# Patient Record
Sex: Female | Born: 1980 | Hispanic: No | Marital: Married | Smoking: Never smoker
Health system: Southern US, Community
[De-identification: ages and names within clinical notes are randomized; demographics above are authoritative.]

---

## 2008-05-10 ENCOUNTER — Encounter: Payer: Self-pay | Admitting: Family

## 2008-05-10 ENCOUNTER — Ambulatory Visit: Payer: Self-pay | Admitting: Obstetrics & Gynecology

## 2008-05-10 LAB — CONVERTED CEMR LAB
Antibody Screen: NEGATIVE
Basophils Relative: 0 % (ref 0–1)
Eosinophils Absolute: 0 10*3/uL (ref 0.0–0.7)
Eosinophils Relative: 0 % (ref 0–5)
HCT: 38.9 % (ref 36.0–46.0)
Hepatitis B Surface Ag: NEGATIVE
MCHC: 33.4 g/dL (ref 30.0–36.0)
MCV: 97.7 fL (ref 78.0–100.0)
Neutrophils Relative %: 65 % (ref 43–77)
Platelets: 272 10*3/uL (ref 150–400)

## 2008-05-11 ENCOUNTER — Ambulatory Visit (HOSPITAL_COMMUNITY): Admission: RE | Admit: 2008-05-11 | Discharge: 2008-05-11 | Payer: Self-pay | Admitting: Family Medicine

## 2008-05-22 ENCOUNTER — Encounter: Payer: Self-pay | Admitting: Obstetrics & Gynecology

## 2008-05-22 ENCOUNTER — Ambulatory Visit: Payer: Self-pay | Admitting: Family Medicine

## 2008-05-22 LAB — CONVERTED CEMR LAB
AST: 19 units/L (ref 0–37)
Albumin: 3.7 g/dL (ref 3.5–5.2)
Alkaline Phosphatase: 95 units/L (ref 39–117)
BUN: 8 mg/dL (ref 6–23)
Calcium: 8.8 mg/dL (ref 8.4–10.5)
Chloride: 105 meq/L (ref 96–112)
Glucose, Bld: 70 mg/dL (ref 70–99)
Potassium: 4.4 meq/L (ref 3.5–5.3)
Sodium: 139 meq/L (ref 135–145)
Total Protein: 6.9 g/dL (ref 6.0–8.3)

## 2008-05-24 ENCOUNTER — Ambulatory Visit (HOSPITAL_COMMUNITY): Admission: RE | Admit: 2008-05-24 | Discharge: 2008-05-24 | Payer: Self-pay | Admitting: Obstetrics & Gynecology

## 2008-06-01 ENCOUNTER — Ambulatory Visit (HOSPITAL_COMMUNITY): Admission: RE | Admit: 2008-06-01 | Discharge: 2008-06-01 | Payer: Self-pay | Admitting: Family Medicine

## 2008-06-07 ENCOUNTER — Ambulatory Visit: Payer: Self-pay | Admitting: Obstetrics & Gynecology

## 2008-06-21 ENCOUNTER — Ambulatory Visit: Payer: Self-pay | Admitting: Obstetrics & Gynecology

## 2008-06-28 ENCOUNTER — Ambulatory Visit: Payer: Self-pay | Admitting: Obstetrics & Gynecology

## 2008-06-28 ENCOUNTER — Encounter: Payer: Self-pay | Admitting: Family

## 2008-06-29 ENCOUNTER — Encounter: Payer: Self-pay | Admitting: Family

## 2008-07-05 ENCOUNTER — Ambulatory Visit: Payer: Self-pay | Admitting: Obstetrics & Gynecology

## 2008-07-12 ENCOUNTER — Ambulatory Visit: Payer: Self-pay | Admitting: Obstetrics & Gynecology

## 2008-07-19 ENCOUNTER — Ambulatory Visit: Payer: Self-pay | Admitting: Obstetrics & Gynecology

## 2008-07-24 ENCOUNTER — Ambulatory Visit: Payer: Self-pay | Admitting: Obstetrics & Gynecology

## 2008-07-27 ENCOUNTER — Ambulatory Visit: Payer: Self-pay | Admitting: Advanced Practice Midwife

## 2008-07-27 ENCOUNTER — Inpatient Hospital Stay (HOSPITAL_COMMUNITY): Admission: AD | Admit: 2008-07-27 | Discharge: 2008-07-30 | Payer: Self-pay | Admitting: Obstetrics & Gynecology

## 2009-03-26 ENCOUNTER — Ambulatory Visit: Payer: Self-pay | Admitting: Family Medicine

## 2009-03-26 DIAGNOSIS — N644 Mastodynia: Secondary | ICD-10-CM

## 2010-03-29 ENCOUNTER — Encounter: Payer: Self-pay | Admitting: Family Medicine

## 2010-03-29 ENCOUNTER — Ambulatory Visit: Admission: RE | Admit: 2010-03-29 | Discharge: 2010-03-29 | Payer: Self-pay | Source: Home / Self Care

## 2010-03-29 DIAGNOSIS — R630 Anorexia: Secondary | ICD-10-CM | POA: Insufficient documentation

## 2010-03-29 DIAGNOSIS — F329 Major depressive disorder, single episode, unspecified: Secondary | ICD-10-CM | POA: Insufficient documentation

## 2010-03-29 LAB — CONVERTED CEMR LAB
ALT: 16 units/L (ref 0–35)
AST: 22 units/L (ref 0–37)
Alkaline Phosphatase: 56 units/L (ref 39–117)
CO2: 25 meq/L (ref 19–32)
Creatinine, Ser: 0.56 mg/dL (ref 0.40–1.20)
HCT: 43.7 % (ref 36.0–46.0)
MCHC: 34.1 g/dL (ref 30.0–36.0)
MCV: 90.9 fL (ref 78.0–100.0)
Platelets: 244 10*3/uL (ref 150–400)
RDW: 12.9 % (ref 11.5–15.5)
Sodium: 138 meq/L (ref 135–145)
Total Bilirubin: 1 mg/dL (ref 0.3–1.2)
Total Protein: 8 g/dL (ref 6.0–8.3)
WBC: 8.9 10*3/uL (ref 4.0–10.5)

## 2010-04-10 IMAGING — US US OB DETAIL+14 WK
1 series · 14 of 28 positions shown · non-contrast
Comparison: none

OBSTETRICAL ULTRASOUND:
 This ultrasound exam was performed in the [HOSPITAL] Ultrasound Department.  The OB US report was generated in the AS system, and faxed to the ordering physician.  This report is also available in [REDACTED] PACS.

[Series 1: us ob detail +14 wk · 14 of 80 slices shown]
[im 3/80]
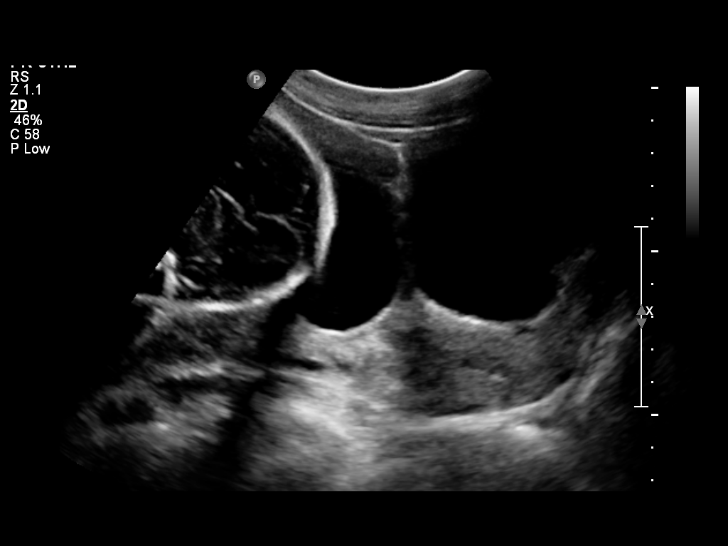
[im 9/80]
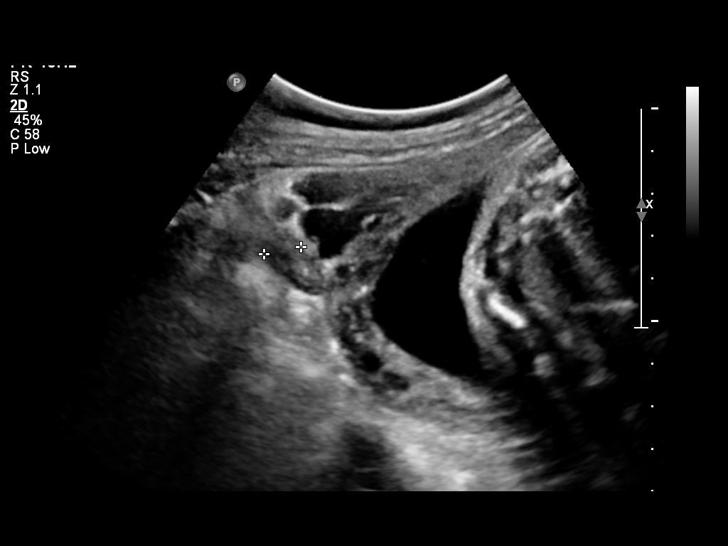
[im 15/80]
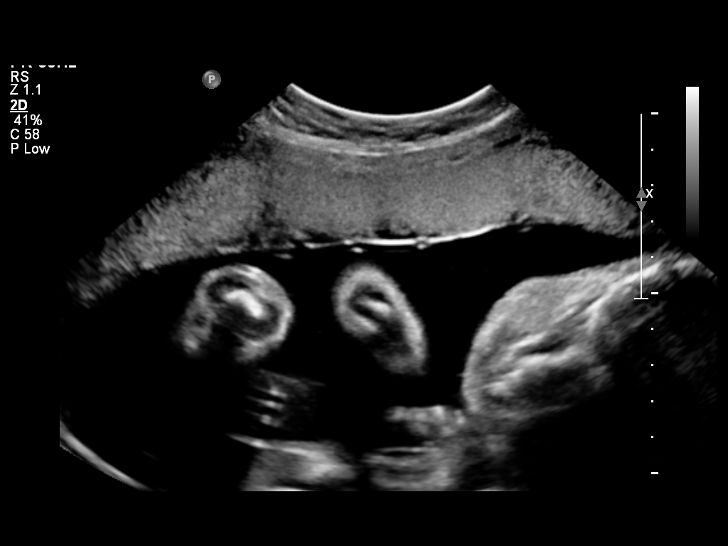
[im 21/80]
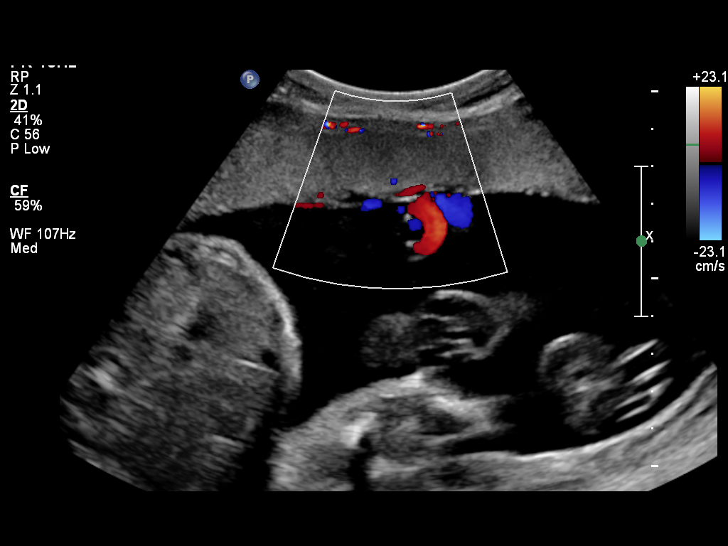
[im 27/80]
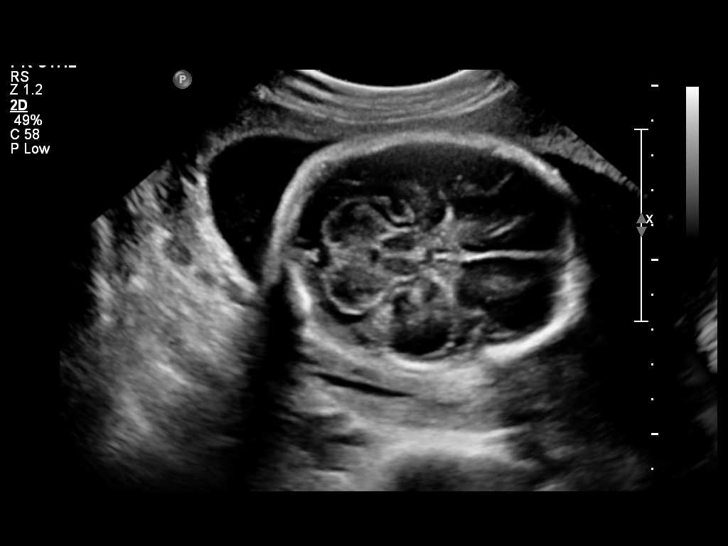
[im 33/80]
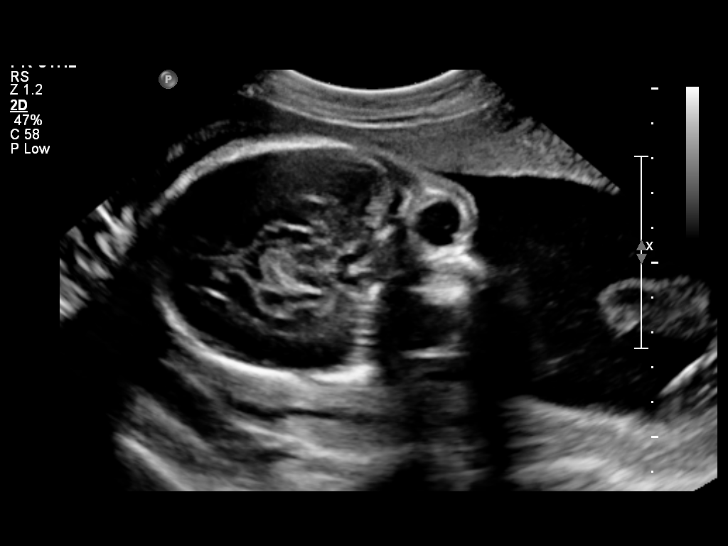
[im 39/80]
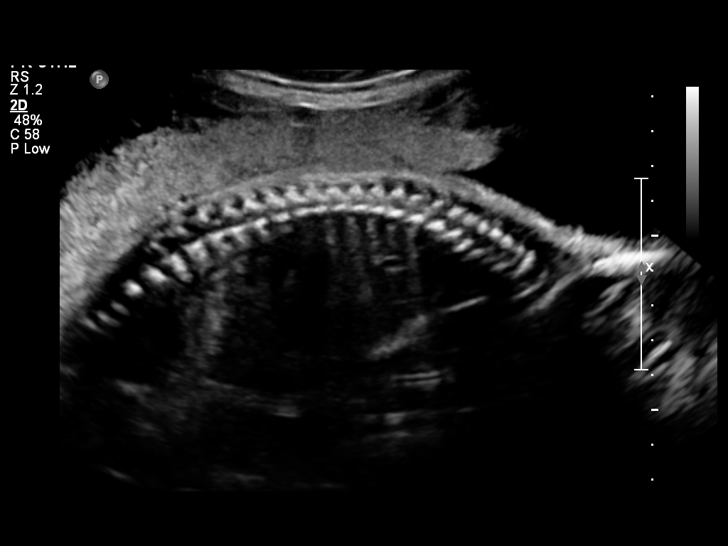
[im 44/80]
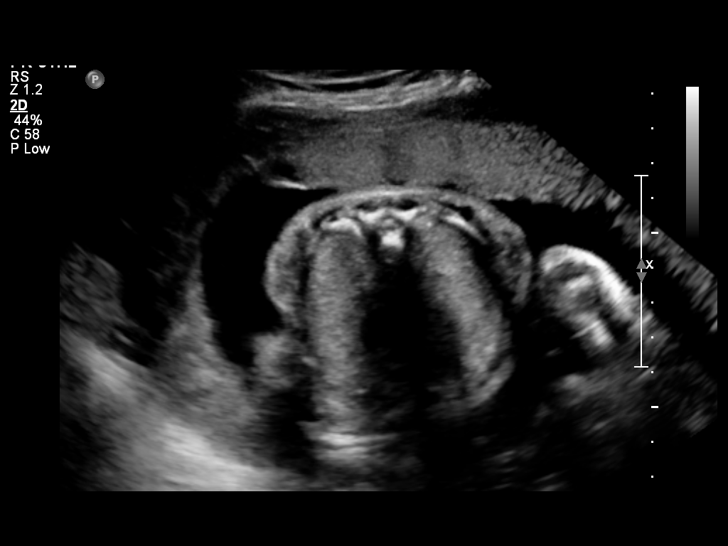
[im 50/80]
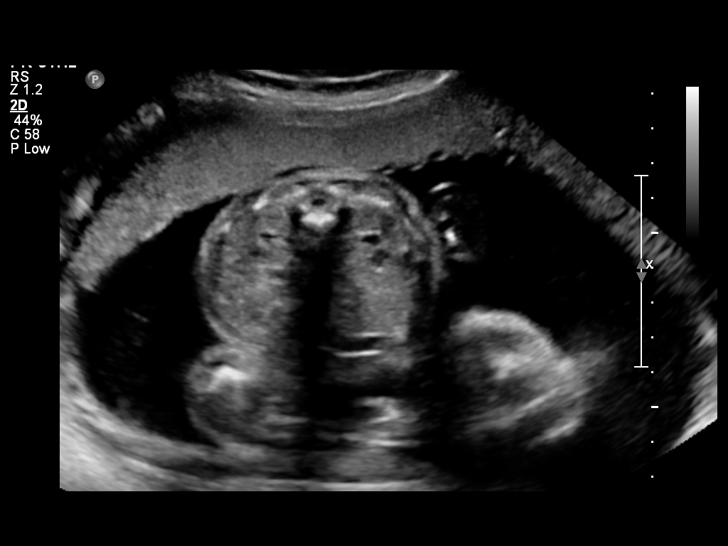
[im 56/80]
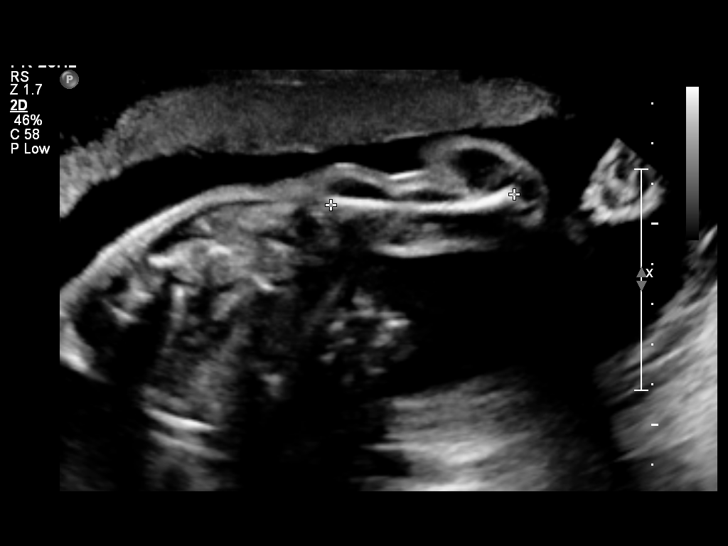
[im 62/80]
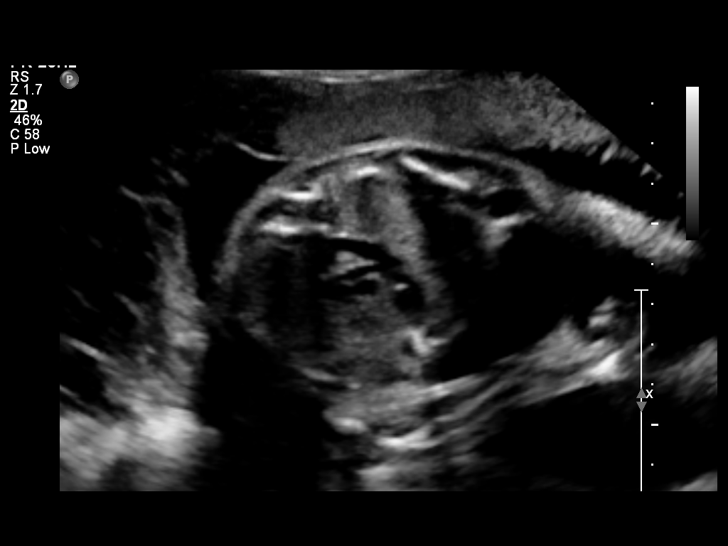
[im 68/80]
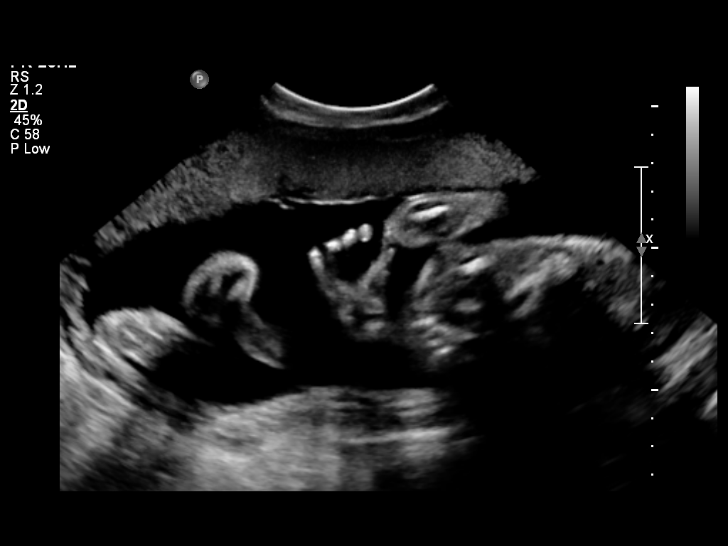
[im 74/80]
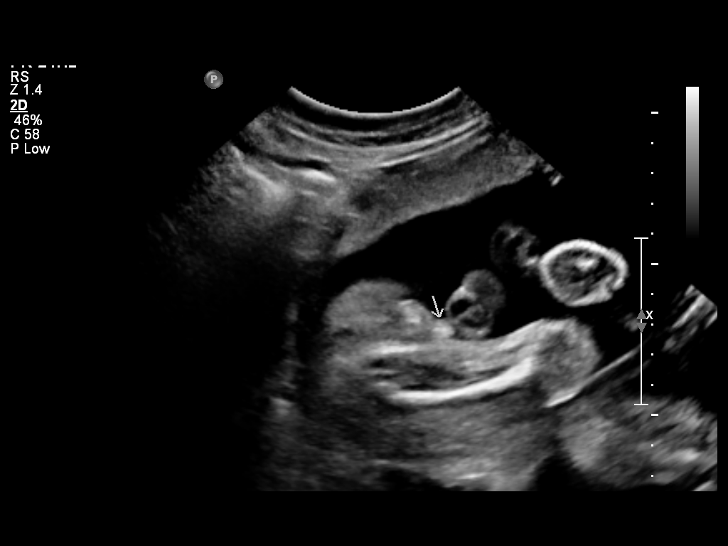
[im 80/80]
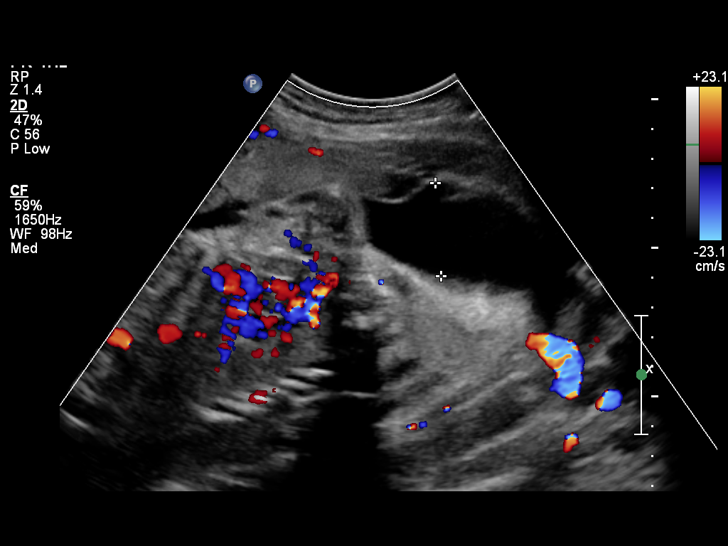

[14 of 28 positions shown; findings below may reference images not displayed]

IMPRESSION: See AS Obstetric US report.

## 2010-04-23 IMAGING — US US ABDOMEN COMPLETE
1 series · 14 of 25 positions shown · non-contrast
Comparison: None

CLINICAL DATA: Right-sided abdominal pain and nausea.  32 weeks
pregnant.

ABDOMINAL ULTRASOUND
TECHNIQUE: Abdominal ultrasound examination was performed
including evaluation of the liver, gallbladder, bile ducts,
pancreas, kidneys, spleen, IVC, and abdominal aorta.

[Series 1: us abdomen complete · 14 of 83 slices shown]
[im 1/83]
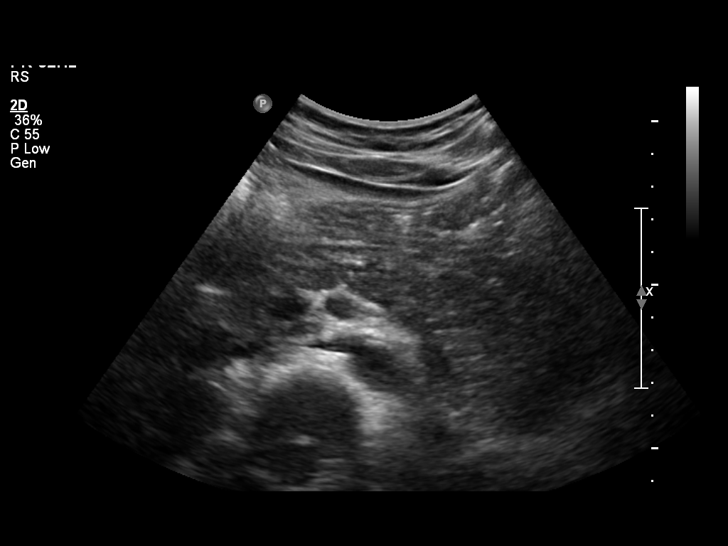
[im 7/83]
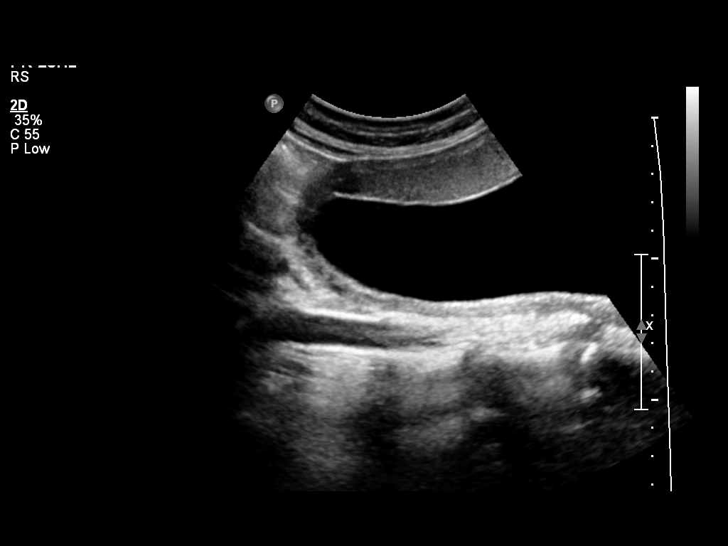
[im 14/83]
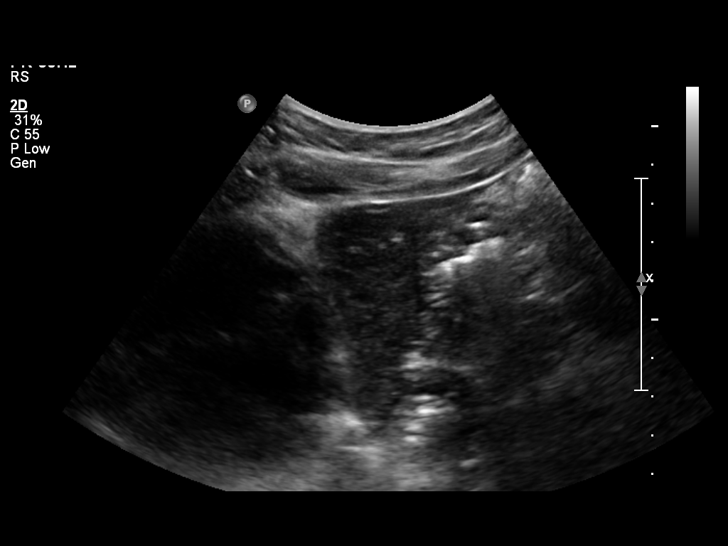
[im 21/83]
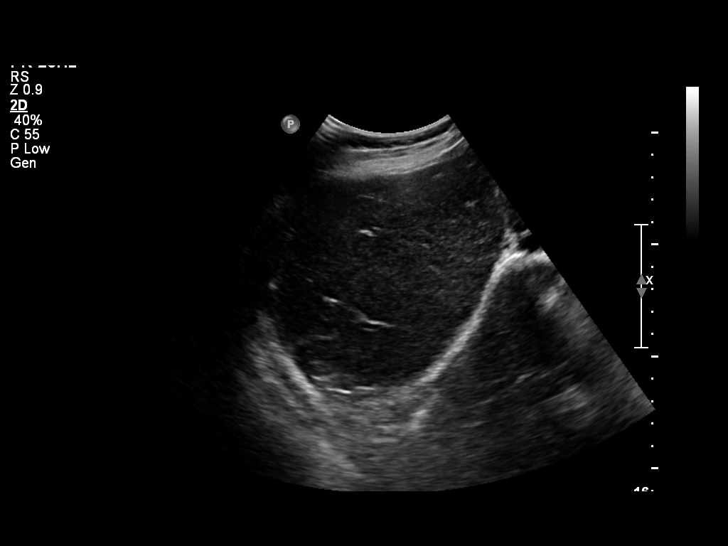
[im 28/83]
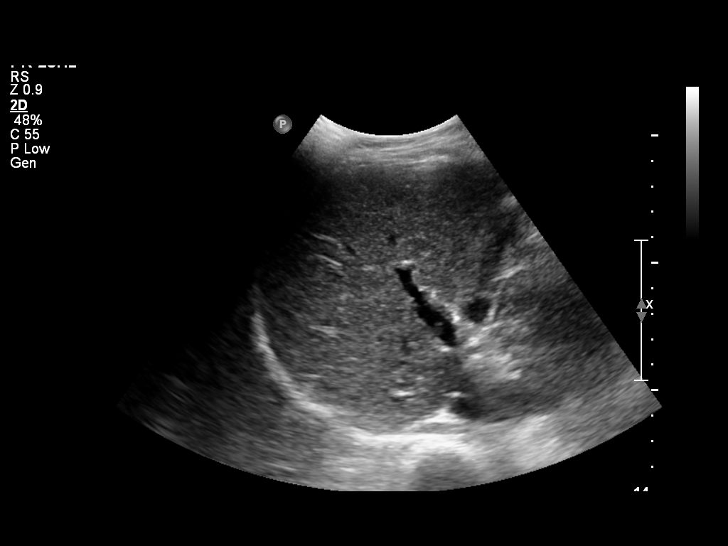
[im 31/83]
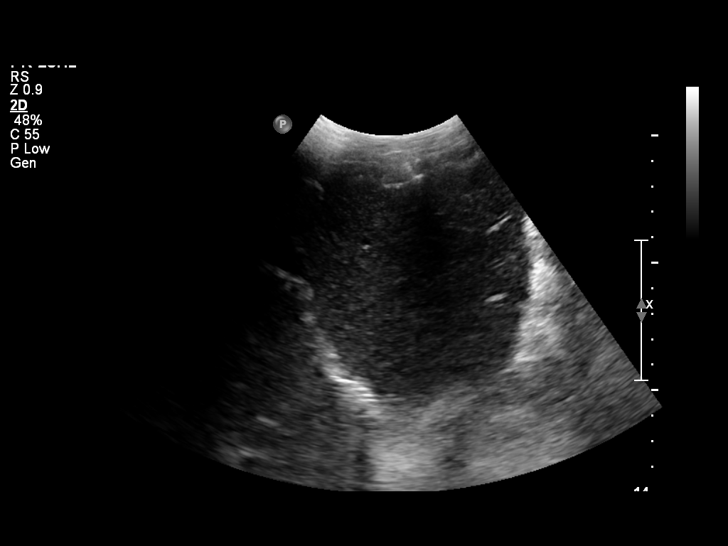
[im 38/83]
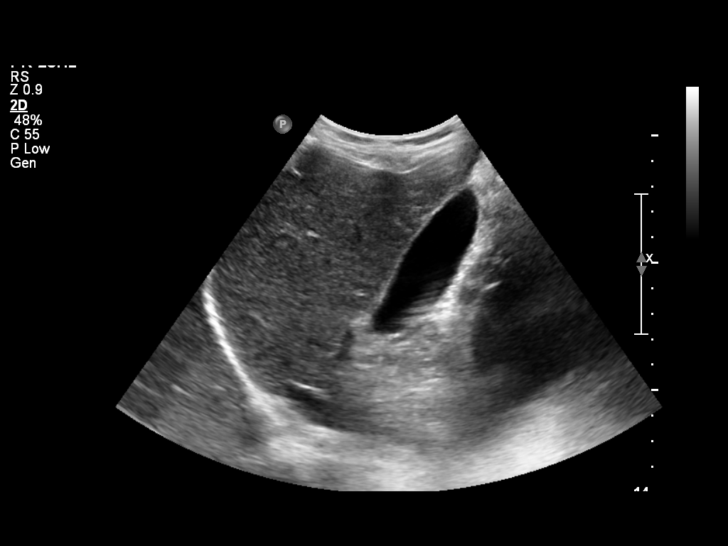
[im 45/83]
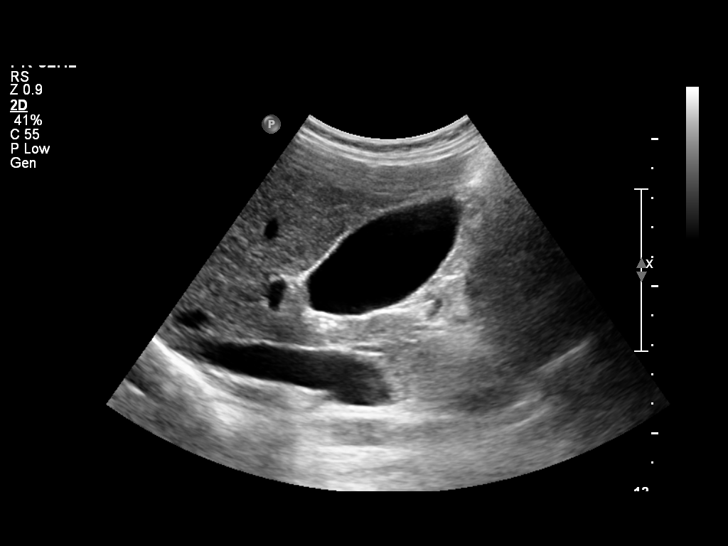
[im 52/83]
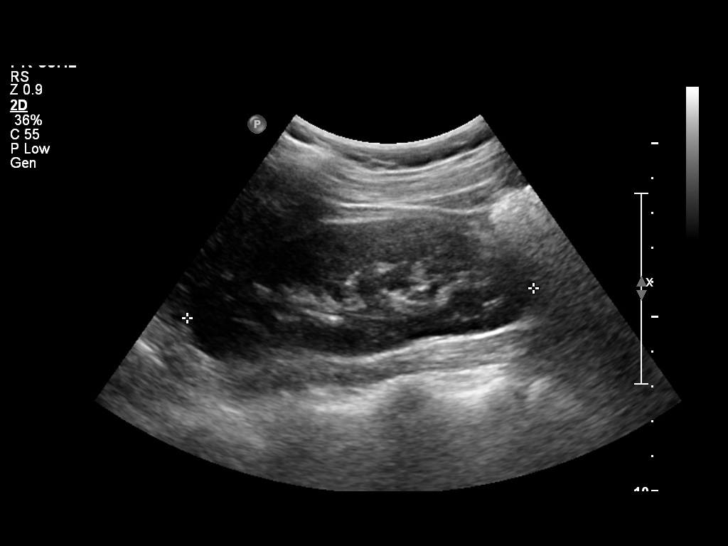
[im 55/83]
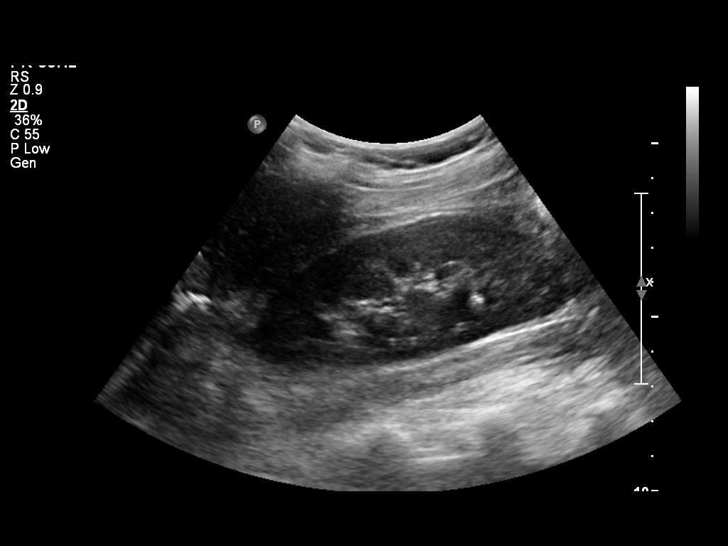
[im 62/83]
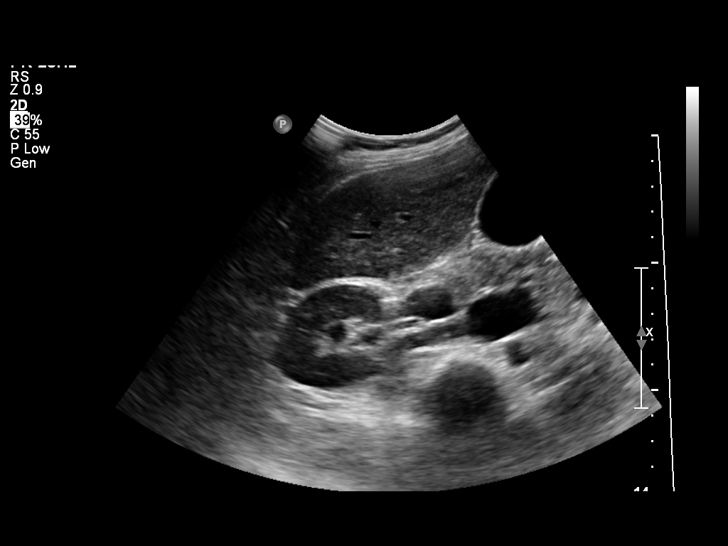
[im 69/83]
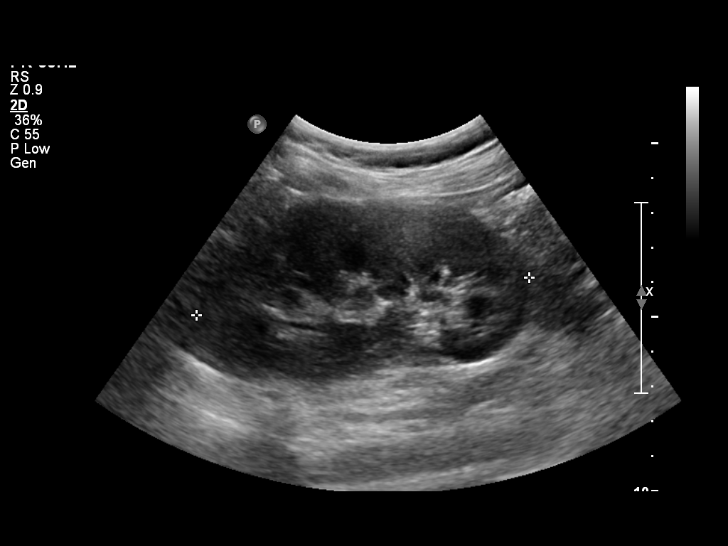
[im 76/83]
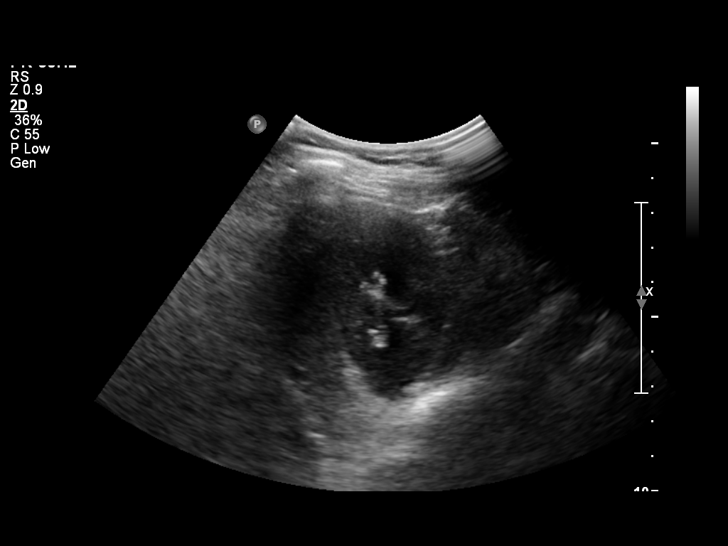
[im 83/83]
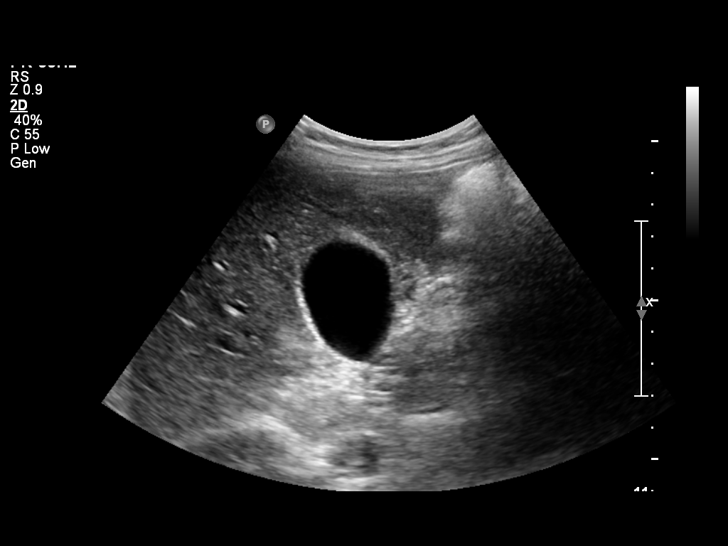

[14 of 25 positions shown; findings below may reference images not displayed]

FINDINGS: Gallbladder:  No gallstones, gallbladder wall thickening, or
pericholecystic fluid.

Common bile duct: Within normal limits in caliber.

Liver:  No focal parenchymal abnormalities.  Within normal limits
in parenchymal echogenicity.

Inferior vena cava:  Visualized portion unremarkable.

Pancreas:  Visualized portion unremarkable.

Spleen:  Within normal limits in size and echogenicity.

Right kidney:  Within normal limits in size and echogenicity. No
evidence of mass or hydronephrosis.

Left kidney:  Within normal limits in size and echogenicity. No
evidence of mass or hydronephrosis.

Abdominal aorta:  Within normal limits in caliber.
IMPRESSION: Negative abdominal ultrasound. No evidence of gallstones or
hydronephrosis.

## 2010-04-23 NOTE — Assessment & Plan Note (Signed)
Summary: NEW PT/KH   Vital Signs:  Patient profile:   30 year old female Height:      61 inches Weight:      103.5 pounds BMI:     19.63 Pulse rate:   85 / minute BP sitting:   93 / 63  (right arm)  Vitals Entered By: Arlyss Repress CMA, (March 26, 2009 10:26 AM) CC: fibroids in breast. pain up to 5/10. was diagnosed with fibroids in [place]. pt is breast feeding. Is Patient Diabetic? No Pain Assessment Patient in pain? yes     Location: breast Intensity: 2   CC:  fibroids in breast. pain up to 5/10. was diagnosed with fibroids in [place]. pt is breast feeding.Marland Kitchen  History of Present Illness: Ashley Pittman comes in today as a new patient to our clinic.  She is a refugee from [place], via a camp in [place] (was there for 18 years) and has been here almost one year later this month.  She had her second child, a son, here in May.  He is 32 months old now. She comes in to establish care.  Only complaint is breast pain.  Otherwise healthy. 1) Breast pain - states diagnosed with "fibroids" in her breast in [place].  States she had a mammogram in [place] shortly before coming here.  Asked if she meant fibrocystic disease and she said yes, that's what she thought it was.  Actually breast pain improved while pregnant and during breastfeeding.  Still breastfeeding but pain is starting to return.  Does drink a fair amount of soda.  Takes one ibuprofen occassionally with relief but pain comes back, she states.  Pain was always worse with her period, though has been amenorrheic since childbirth.  2) Preventitive - up to date with pap (part of prenatal care).  Otherwise healthy.    Habits & Providers  Alcohol-Tobacco-Diet     Tobacco Status: never  Current Medications (verified): 1)  Ibuprofen 600 Mg Tabs (Ibuprofen) .Marland Kitchen.. 1 Tab By Mouth Every 6 Hrs If Needed For Pain  Past History:  Past Medical History: Fibrocystic breast disease  Past Surgical History: BTL 07/2008  Family History: Denies any HTN, HLD, CAD,  DM, Cancer.   Social History: Refugee from [place].  Lived in a refugee camp in [place] for 18 years before making it to the [place] in January 2010.  Lives with her husband, her daughter, her son, and her in-laws.  No tobacco, alcohol, drugsSmoking Status:  never  Physical Exam  General:  thin, alert, NAD, vital reviewed Breasts:  No mass, nodules, thickening, tenderness, bulging, retraction, inflamation, nipple discharge or skin changes noted.   Lungs:  Normal respiratory effort, chest expands symmetrically. Lungs are clear to auscultation, no crackles or wheezes. Heart:  Normal rate and regular rhythm. S1 and S2 normal without gallop, murmur, click, rub or other extra sounds. Skin:  no rashes Axillary Nodes:  No palpable lymphadenopathy   Impression & Recommendations:  Problem # 1:  BREAST PAIN, BILATERAL (ICD-611.71) Believe she was told she had fibrocystic breast disease.  Normal breast exam today.  Given she has apparently had a mammogram in [place] and I have no access to it, will obtain screening mammo here (not diagnostic because exam negative today).  Advised ibuprofen as needed and to avoid caffeine.  Could consider OCPs (even though she has had BTL) for hormonal control of breast pain at a later date if worsens/becomes more frequent.  Orders: Mammogram (Screening) (Mammo)  Complete Medication List: 1)  Ibuprofen 600 Mg Tabs (Ibuprofen) .Marland Kitchen.. 1 tab by mouth every 6 hrs if needed for pain  Patient Instructions: 1)  WHen the breast pain is bad, use ibuprofen.  It is safe, even if you are breastfeeding.  I have given you a prescription for the stronger tablets.  You can use these as directed or you can use the smaller tablets that you can buy without a prescription, whatever you prefer.  It is okay to take them a few times a day when the pain is bad. 2)  Avoid caffeine (sodas, coffee, tea).  Caffeine can make fibrocystic breast pain worse. 3)  Please return in 6 months for your pap smear and  regular physical exam.  Prescriptions: IBUPROFEN 600 MG TABS (IBUPROFEN) 1 tab by mouth every 6 hrs if needed for pain  #40 x 3   Entered and Authorized by:   Ardeen Garland  MD   Signed by:   Ardeen Garland  MD on 03/26/2009   Method used:   Print then Give to Patient   RxID:   [id number]

## 2010-04-25 NOTE — Assessment & Plan Note (Signed)
Summary: loss of appetite/bmc   Vital Signs:  Patient profile:   30 year old female Height:      61 inches Weight:      104 pounds BMI:     19.72 Temp:     98.7 degrees F oral Pulse rate:   69 / minute BP sitting:   104 / 72  (left arm) Cuff size:   regular  Vitals Entered By: Tessie Fass CMA (March 29, 2010 9:04 AM) CC: loss of appetite, Depression Pain Assessment Patient in pain? no        Primary Care Provider:  Ritta Hammes MD  CC:  loss of appetite and Depression.  History of Present Illness: 30 y/o F here for loss of appetite and depression.  She is accompanied by her husband, Binod.  He is concerned that he has to remind pt to eat all the time.   He feels that she does not eat enough.  She has a 8 month-old son at home that she was breastfeeding until a couple of months ago.  Her husband asked her to stop breast feeding because he thought that she was too thin to breast feed.  Pt states that her appetite comes and goess.  In the past 2 wks she thinks that she is eating more.  She is not sure if she is eating because she knows she needs to or if she actually enjoys the food she eats.   Pt feels that she is sad most of the time.  She does not understand why at night she would have uncontrolled crying and laughing episodes.  She states that she has no reason to cry or laugh like this.   She thinks that she started having feelings of sadness and anhedonia during the time she was pregnant with her first child, who is now 78 y/o.  After she gave birth she stopped going out with friends, going to the movies, going shopping.  Her husband states that he has to really encourage her to go shopping to splurge on herself with something nice.  Even with these feelings pt still goes to work regularly.  She works full time in housekeeping.  She states that she enjoys going to work.  She likes being able to get away.    Denies any traumatic events in her younger life, esp during refuge camp in  [place] Denies family history of depression Denies previous admission for psych disorder Denies being treated for depression in the past Endorses that she sometimes feels like hurting herself, but would not follow through because of her children.    Depression History:      Positive alarm features for depression include insomnia.  However, she denies significant weight loss, hypersomnia, fatigue (loss of energy), feelings of worthlessness (guilt), impaired concentration (indecisiveness), and recurrent thoughts of death or suicide.  The patient denies symptoms of a manic disorder including less need for sleep, flight of ideas, excessive buying sprees, and excessive sexual indiscretions.        Suicide risk questions reveal that she has thought about ending her life.  The patient denies that she feels like life is not worth living, denies that she wishes that she were dead, and denies that she has planned how to end her life.          Habits & Providers  Alcohol-Tobacco-Diet     Tobacco Status: never  Current Medications (verified): 1)  Multivitamins  Tabs (Multiple Vitamin) .Marland Kitchen.. 1 Tab By  Mouth Daily  Allergies (verified): No Known Drug Allergies  Social History: Refugee from [place].  Lived in a refugee camp in [place] for 18 years before making it to the [place] in January 2010.  Lives with her husband Brandi Tomlinson), her daughter (84 y/o), her son (20 m/o)   No tobacco, alcohol, drugs  Works in housekeeping full time.   Review of Systems General:  Denies chills, fatigue, and fever. CV:  Denies fainting, fatigue, and weight gain. Resp:  Denies cough, shortness of breath, and wheezing. Psych:  Complains of depression, easily tearful, suicidal thoughts/plans, and thoughts /plans of harming others; denies alternate hallucination ( auditory/visual), anxiety, easily angered, irritability, mental problems, panic attacks, sense of great danger, thoughts of violence, and unusual visions or  sounds. Endo:  Complains of weight change; denies excessive hunger, excessive thirst, and heat intolerance. Heme:  Denies abnormal bruising, bleeding, fevers, and pallor.  Physical Exam  General:  Well-developed,well-nourished,in no acute distress; alert,appropriate and cooperative throughout examination. Vitals reviewed.  Psych:  Cognition and judgment appear intact. Alert and cooperative with normal attention span and concentration. No apparent delusions, illusions, hallucinationsgood eye contact, not anxious appearing, not agitated, not suicidal, not homicidal, depressed affect, tearful, and judgment fair.   Additional Exam:  PHQ-9 Questionaire 1.  Lilttle interest or pleasure in doing things-- 3 2. Feeling down, depresses, or hopeless--1 3. Trouble falling asleep or staying asleep, or sleeping too much --2 4.  Feeling tired or having little energy --1 5. Poor appetite or overeating--2 6. Feeling bad about yourself--2 7. Trouble concentrating --1 8. Moving or speaking so slowly that other people could have noticed --1 9. Thought s that you would be better off dead or hurting yourself --0  Total 13: Moderate  Somewhat difficult    Impression & Recommendations:  Problem # 1:  LOSS OF APPETITE (ICD-783.0) Assessment New Although pt made appt for loss of appetite, likley this stems from depression.  See below. Weight today similar to weight in 03/2009.    Orders: Comp Met-[facility] [phone]) Miscellaneous Lab Charge-[facility] [phone]) [facility]- Est Level  3 (91478)  Problem # 2:  DEPRESSION, MAJOR (ICD-296.20) Assessment: New Pt describes intermitten feelings of sadness, uncontrolled crying/laughing, anhedonia present x years.  She first experienced these feelings after the birth of her first child 9 yrs ago.  During her pregnancy with her youngest child she had suicidal ideations.  Althought in the past she has had thoughts of hurting herself, she does not have a plan and states that she would  not follow through because she has children to take care of.  She currently denies SI/HI.  Pt does exhibit signs of depression and cried during this office visit.  She scored a 13 on the PHQ-9 questionaire, placing her depression in the moderate range.  Discussed with pt that likely she has depression and the best treatment option includes Medication + Therapy.  Pt is amendable to both.  Will start with Zoloft 50mg  x 1 wk then increase to 100mg  daily.  Pt to rtc in 1 month for f/u.  I've given her contact info for Spero Geralds and Letha Cape (pt prefers female provider).    I wil also get basic labs today to rule out other causes of her current symptoms CBC (anemia/fatigue), Cmet (electrolytes, decreased apptetite), Prealbumin (dec appetite), and TSH.     Orders: CBC-[facility] (29562) TSH-[facility] ([id number]) [facility]- Est Level  3 (96295)  Complete Medication List: 1)  Multivitamins Tabs (Multiple vitamin) .Marland Kitchen.. 1 tab  by mouth daily 2)  Zoloft 50 Mg Tabs (Sertraline hcl) .Marland Kitchen.. 1 tab by mouth every day x 1 week.  then 2 tabs every day.  Patient Instructions: 1)  Please schedule a follow-up appointment in 1 month to discuss your sadness. 2)  I'm sorry that you have been feeling sad for many years.  I think that it is good that you are taking a step to help yourself feel better.  As we discussed today, I think that the better option to treat your sadness is with medication and therapy.  These are the names of two therapists that I would like you to call and make an appointment with: 3)  Letha Cape, LCSW, ACSW, [phone]  4)  Spero Geralds, PsyD, [phone] [facility]) 5)  Zoloft 50mg  by mouth every day for one week.  Then increase to 2 tabs daily until I see you again.   Prescriptions: ZOLOFT 50 MG TABS (SERTRALINE HCL) 1 tab by mouth every day x 1 week.  Then 2 tabs every day.  #60 x 1   Entered and Authorized by:   Angeline Slim MD   Signed by:   Angeline Slim MD on 03/29/2010    Method used:   Electronically to        [facility]. [address] [phone]* (retail)       [address]       [place], [place]  [address]       Ph: [phone] or [phone]       Fax: [phone]   RxID:   [id number]    Orders Added: 1)  CBC-[facility] [85027] 2)  TSH-[facility] [[id number]] 3)  Comp Met-[facility] [[id number]] 4)  Miscellaneous Lab Charge-[facility] [99999] 5)  [facility]- Est Level  3 [09323]

## 2010-07-02 LAB — CBC
HCT: 35.7 % — ABNORMAL LOW (ref 36.0–46.0)
HCT: 38.6 % (ref 36.0–46.0)
Hemoglobin: 13.4 g/dL (ref 12.0–15.0)
MCHC: 34.6 g/dL (ref 30.0–36.0)
MCHC: 34.6 g/dL (ref 30.0–36.0)
MCV: 96.5 fL (ref 78.0–100.0)
MCV: 97.5 fL (ref 78.0–100.0)
Platelets: 166 10*3/uL (ref 150–400)
RBC: 4 MIL/uL (ref 3.87–5.11)
RDW: 13.2 % (ref 11.5–15.5)
WBC: 17.5 10*3/uL — ABNORMAL HIGH (ref 4.0–10.5)

## 2010-07-03 LAB — POCT URINALYSIS DIP (DEVICE)
Bilirubin Urine: NEGATIVE
Bilirubin Urine: NEGATIVE
Glucose, UA: NEGATIVE mg/dL
Glucose, UA: NEGATIVE mg/dL
Glucose, UA: NEGATIVE mg/dL
Hgb urine dipstick: NEGATIVE
Nitrite: NEGATIVE
Nitrite: NEGATIVE
Nitrite: NEGATIVE
Protein, ur: NEGATIVE mg/dL
Protein, ur: 30 mg/dL — AB
Protein, ur: NEGATIVE mg/dL
Specific Gravity, Urine: 1.01 (ref 1.005–1.030)
Urobilinogen, UA: 0.2 mg/dL (ref 0.0–1.0)
Urobilinogen, UA: 0.2 mg/dL (ref 0.0–1.0)
Urobilinogen, UA: 0.2 mg/dL (ref 0.0–1.0)
pH: 7 (ref 5.0–8.0)
pH: 8 (ref 5.0–8.0)

## 2010-07-03 LAB — GLUCOSE, CAPILLARY: Glucose-Capillary: 78 mg/dL (ref 70–99)

## 2010-07-04 LAB — POCT URINALYSIS DIP (DEVICE)
Bilirubin Urine: NEGATIVE
Glucose, UA: NEGATIVE mg/dL
Hgb urine dipstick: NEGATIVE
Ketones, ur: NEGATIVE mg/dL
Ketones, ur: NEGATIVE mg/dL
Nitrite: NEGATIVE
Protein, ur: NEGATIVE mg/dL
Protein, ur: NEGATIVE mg/dL
Protein, ur: NEGATIVE mg/dL
Specific Gravity, Urine: <=1.005 (ref 1.005–1.030)
Specific Gravity, Urine: 1.015 (ref 1.005–1.030)
Urobilinogen, UA: 0.2 mg/dL (ref 0.0–1.0)
Urobilinogen, UA: 0.2 mg/dL (ref 0.0–1.0)
pH: 6.5 (ref 5.0–8.0)

## 2010-07-09 LAB — POCT URINALYSIS DIP (DEVICE)
Bilirubin Urine: NEGATIVE
Hgb urine dipstick: NEGATIVE
Nitrite: NEGATIVE
Specific Gravity, Urine: 1.02 (ref 1.005–1.030)
Urobilinogen, UA: 0.2 mg/dL (ref 0.0–1.0)
pH: 7 (ref 5.0–8.0)

## 2010-08-06 NOTE — Op Note (Signed)
NAMECHARISMA, Ashley Pittman NO.:  [id number]   MEDICAL RECORD NO.:  [id number]         PATIENT TYPE:  WINP   LOCATION:                                [facility]   PHYSICIAN:  Tilda Burrow, M.D. DATE OF BIRTH:  1981-02-12   DATE OF PROCEDURE:  07/29/2008  DATE OF DISCHARGE:                               OPERATIVE REPORT   PREOPERATIVE DIAGNOSIS:  Desire for elective postpartum sterilization.   POSTOPERATIVE DIAGNOSIS:  Desire for elective postpartum sterilization.   PROCEDURE:  Postpartum tubal sterilization, Filshie clip application.   SURGEON:  Tilda Burrow, MD   ASSISTANT:  None.   ANESTHESIA:  Epidural.   COMPLICATIONS:  None   FINDINGS:  Normal uterus and tubes bilaterally.   ESTIMATED BLOOD LOSS:  Minimal.   INDICATIONS:  Requested elective permanent sterilization with prior  consents obtained and documented in the chart.   DETAILS OF THE PROCEDURE:  The patient was taken to the operating room.  Procedure confirmed.  Desires for permanent sterilization reaffirmed and  a time-out conducted.  After prepping and draping the abdomen, an  infraumbilical semicircular 2-cm skin incision was made and sharp  dissection through the skin to the fascia was performed.  Metzenbaum  scissors were used to open the fascia in blunt, intraperitoneal cavity  easily accomplished.  The [employer] retractor was used to rotate the  incision over to the right side where the right round ligament could be  identified and the right fallopian tube confirmed to its fimbriated end.  The mid segment portion of the tube was identified, confirmed position,  and Filshie clip applied with good tissue approximation confirmed.  Incision was rotated to the left side and some rotating __________ on  the left fallopian tube.  Hemostasis was satisfactory.  Incisions were  closed with subcuticular 4-0 Vicryl closure of the peritoneum and 0  Vicryl closure of the fascia with  subcuticular 3-0 Vicryl closure of the  skin with Steri-Strips applied.  The patient tolerated the procedure  well and went to recovery room in good condition.     Tilda Burrow, M.D.  Electronically Signed    JVF/MEDQ  D:  08/09/2008  T:  08/10/2008  Job:  [id number]

## 2010-08-06 NOTE — Discharge Summary (Signed)
NAMETEILA, Ashley Pittman NO.:  [id number]   MEDICAL RECORD NO.:  [id number]          PATIENT TYPE:  INP   LOCATION:  9147                          [facility]ILITY:  [facility]   PHYSICIAN:  Tilda Burrow, M.D. DATE OF BIRTH:  1980/09/17   DATE OF ADMISSION:  07/27/2008  DATE OF DISCHARGE:                               DISCHARGE SUMMARY   ADMITTING DIAGNOSES:  1. Intrauterine pregnancy at 41-0/7 weeks.  2. Unfavorable cervix.  3. Desires sterilization.   DISCHARGE DIAGNOSES:  1. Intrauterine pregnancy at 41-0/7 weeks.  2. Unfavorable cervix.  3. Desires sterilization.   PROCEDURES:  Postpartum bilateral tubal ligation with Filshie clips.   HOSPITAL COURSE:  [name] is a 30 year old gravida 2, para 1-0-0-1  who presented at 40-0/[redacted] weeks gestation for induction due to post dates.  Her pregnancy has been followed by the [facility], where the  patient transferred in from [place] at [redacted] weeks gestation and she was  receiving prenatal care in [place], was able to bring her records.  Her  pregnancy has been essentially unremarkable.  She received Cytotec x2  doses and progressed to complete dilation using Pitocin.  She labor down  and then pushed to delivery of a viable female weight of 7 pounds and 6  ounces at Apgars of 9 at 1 minutes and 9 at 5 minutes.  Infant was taken  to the full-term nursery in good condition.  The patient was still  desiring postpartum sterilization.  She had that performed on May 8 by  Dr. Christin Bach and by postpartum day #2/postop day #1, the patient  was doing well and was desiring discharge.  Vital signs were stable.  She was afebrile and she was deemed to have received a full benefit of  her hospital stay.  Pre delivery was 13.4 and post delivery was 12.4.   DISCHARGE INSTRUCTIONS:  per postpartum handout.   DISCHARGE MEDICATIONS:  1. Vicodin one to two p.o. q.4-6 h p.r.n. pain.  2. Motrin 600 mg one p.o. q.6 h p.r.n. pain.   DISCHARGE FOLLOWUP:  [facility] or  as needed.       Ashley Pittman, C.N.M.      Tilda Burrow, M.D.  Electronically Signed    KS/MEDQ  D:  07/30/2008  T:  07/30/2008  Job:  [id number]

## 2019-06-24 ENCOUNTER — Ambulatory Visit: Payer: Self-pay | Attending: Internal Medicine

## 2019-06-24 DIAGNOSIS — Z23 Encounter for immunization: Secondary | ICD-10-CM

## 2019-07-19 ENCOUNTER — Ambulatory Visit: Payer: Self-pay | Attending: Internal Medicine

## 2019-07-19 DIAGNOSIS — Z23 Encounter for immunization: Secondary | ICD-10-CM

## 2019-11-06 ENCOUNTER — Emergency Department (HOSPITAL_COMMUNITY)
Admission: EM | Admit: 2019-11-06 | Discharge: 2019-11-07 | Disposition: A | Discharge status: Home / Self Care | Payer: Medicaid Other | Attending: Emergency Medicine | Admitting: Emergency Medicine

## 2019-11-06 ENCOUNTER — Other Ambulatory Visit: Payer: Self-pay

## 2019-11-06 ENCOUNTER — Encounter (HOSPITAL_COMMUNITY): Payer: Self-pay | Admitting: Emergency Medicine

## 2019-11-06 DIAGNOSIS — Z5321 Procedure and treatment not carried out due to patient leaving prior to being seen by health care provider: Secondary | ICD-10-CM | POA: Insufficient documentation

## 2019-11-06 DIAGNOSIS — K0889 Other specified disorders of teeth and supporting structures: Secondary | ICD-10-CM | POA: Insufficient documentation

## 2019-11-06 NOTE — ED Triage Notes (Signed)
Pt c/o left sided dental pain x2 days 

## 2019-11-07 NOTE — ED Notes (Signed)
Pt called 3x with no response 

## 2023-04-11 ENCOUNTER — Emergency Department (HOSPITAL_COMMUNITY): Payer: No Typology Code available for payment source

## 2023-04-11 ENCOUNTER — Other Ambulatory Visit: Payer: Self-pay

## 2023-04-11 ENCOUNTER — Emergency Department (HOSPITAL_COMMUNITY)
Admission: EM | Admit: 2023-04-11 | Discharge: 2023-04-12 | Disposition: A | Discharge status: Home / Self Care | Payer: No Typology Code available for payment source | Attending: Emergency Medicine | Admitting: Emergency Medicine

## 2023-04-11 DIAGNOSIS — Y990 Civilian activity done for income or pay: Secondary | ICD-10-CM | POA: Insufficient documentation

## 2023-04-11 DIAGNOSIS — R519 Headache, unspecified: Secondary | ICD-10-CM | POA: Diagnosis not present

## 2023-04-11 DIAGNOSIS — R10A1 Flank pain, right side: Secondary | ICD-10-CM

## 2023-04-11 DIAGNOSIS — R109 Unspecified abdominal pain: Secondary | ICD-10-CM | POA: Insufficient documentation

## 2023-04-11 DIAGNOSIS — M5020 Other cervical disc displacement, unspecified cervical region: Secondary | ICD-10-CM | POA: Diagnosis not present

## 2023-04-11 DIAGNOSIS — Y92511 Restaurant or cafe as the place of occurrence of the external cause: Secondary | ICD-10-CM | POA: Diagnosis not present

## 2023-04-11 DIAGNOSIS — R200 Anesthesia of skin: Secondary | ICD-10-CM | POA: Diagnosis not present

## 2023-04-11 DIAGNOSIS — M542 Cervicalgia: Secondary | ICD-10-CM | POA: Diagnosis present

## 2023-04-11 DIAGNOSIS — M546 Pain in thoracic spine: Secondary | ICD-10-CM

## 2023-04-11 LAB — I-STAT CG4 LACTIC ACID, ED: Lactic Acid, Venous: 2.4 mmol/L (ref 0.5–1.9)

## 2023-04-11 LAB — I-STAT CHEM 8, ED
BUN: 22 mg/dL — ABNORMAL HIGH (ref 6–20)
Calcium, Ion: 0.87 mmol/L — CL (ref 1.15–1.40)
Chloride: 107 mmol/L (ref 98–111)
Creatinine, Ser: 0.6 mg/dL (ref 0.44–1.00)
Glucose, Bld: 119 mg/dL — ABNORMAL HIGH (ref 70–99)
HCT: 40 % (ref 36.0–46.0)
Hemoglobin: 13.6 g/dL (ref 12.0–15.0)
Potassium: 3.4 mmol/L — ABNORMAL LOW (ref 3.5–5.1)
Sodium: 136 mmol/L (ref 135–145)
TCO2: 21 mmol/L — ABNORMAL LOW (ref 22–32)

## 2023-04-11 LAB — COMPREHENSIVE METABOLIC PANEL
ALT: 39 U/L (ref 0–44)
AST: 49 U/L — ABNORMAL HIGH (ref 15–41)
Albumin: 4 g/dL (ref 3.5–5.0)
Alkaline Phosphatase: 60 U/L (ref 38–126)
Anion gap: 10 (ref 5–15)
BUN: 17 mg/dL (ref 6–20)
CO2: 22 mmol/L (ref 22–32)
Calcium: 8.8 mg/dL — ABNORMAL LOW (ref 8.9–10.3)
Chloride: 103 mmol/L (ref 98–111)
Creatinine, Ser: 0.71 mg/dL (ref 0.44–1.00)
GFR, Estimated: >60 mL/min (ref >60)
Glucose, Bld: 124 mg/dL — ABNORMAL HIGH (ref 70–99)
Potassium: 3.2 mmol/L — ABNORMAL LOW (ref 3.5–5.1)
Sodium: 135 mmol/L (ref 135–145)
Total Bilirubin: 0.7 mg/dL (ref 0.0–1.2)
Total Protein: 6.8 g/dL (ref 6.5–8.1)

## 2023-04-11 LAB — CBC WITH DIFFERENTIAL/PLATELET

## 2023-04-11 LAB — PROTIME-INR
INR: 1 (ref 0.8–1.2)
Prothrombin Time: 12.9 s (ref 11.4–15.2)

## 2023-04-11 LAB — SAMPLE TO BLOOD BANK

## 2023-04-11 LAB — ETHANOL: Alcohol, Ethyl (B): <10 mg/dL (ref <10)

## 2023-04-11 MED ORDER — IOHEXOL 350 MG/ML SOLN
75.0000 mL | Freq: Once | INTRAVENOUS | Status: AC | PRN
  Administered 2023-04-11: 75 mL via INTRAVENOUS

## 2023-04-11 MED ORDER — LACTATED RINGERS IV BOLUS
1000.0000 mL | Freq: Once | INTRAVENOUS | Status: AC
Start: 2023-04-11 — End: 2023-04-12
  Administered 2023-04-11: 1000 mL via INTRAVENOUS
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
## 2023-04-11 NOTE — Progress Notes (Signed)
Orthopedic Tech Progress Note Patient Details:  Ashley Pittman 24-Jun-1980 [id number]  Patient ID: Ashley Pittman, female   DOB: Jul 30, 1980, 43 y.o.   MRN: [id number] I attended trauma page. Trinna Post 04/11/2023, 11:25 PM
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
## 2023-04-12 ENCOUNTER — Emergency Department (HOSPITAL_COMMUNITY): Payer: No Typology Code available for payment source

## 2023-04-12 DIAGNOSIS — M5020 Other cervical disc displacement, unspecified cervical region: Secondary | ICD-10-CM | POA: Diagnosis not present

## 2023-04-12 LAB — CBC WITH DIFFERENTIAL/PLATELET
Basophils Relative: 0.1 10*3/uL (ref 0.0–0.1)
Eosinophils Absolute: 1 10*3/uL (ref 0.0–0.5)
Eosinophils Relative: 0.1 10*3/uL (ref 0.0–0.5)
HCT: 39.3 % (ref 36.0–46.0)
Hemoglobin: 13 g/dL (ref 12.0–15.0)
Lymphocytes Relative: 51 %
Lymphs Abs: 5.3 10*3/uL — ABNORMAL HIGH (ref 0.7–4.0)
MCH: 30.7 pg (ref 26.0–34.0)
MCHC: 33.1 g/dL (ref 30.0–36.0)
MCV: 92.9 fL (ref 80.0–100.0)
Monocytes Absolute: 1 10*3/uL (ref 0.1–1.0)
Monocytes Relative: 0.6 10*3/uL (ref 0.1–1.0)
Monocytes Relative: 6 10*3/uL (ref 0.7–4.0)
Neutro Abs: 4.3 10*3/uL (ref 1.7–7.7)
Neutrophils Relative %: 41 %
Other: 0.02 10*3/uL (ref 0.00–0.07)
Platelets: 225 10*3/uL (ref 150–400)
RBC: 4.23 MIL/uL (ref 3.87–5.11)
RDW: 12.3 % (ref 11.5–15.5)
Smear Review: 0 %
WBC: 10.4 10*3/uL (ref 4.0–10.5)
nRBC: 0 % (ref 0.0–0.2)

## 2023-04-12 LAB — HCG, SERUM, QUALITATIVE: Preg, Serum: NEGATIVE

## 2023-04-12 MED ORDER — HYDROMORPHONE HCL 1 MG/ML IJ SOLN
0.5000 mg | Freq: Once | INTRAMUSCULAR | Status: AC
  Administered 2023-04-12: 0.5 mg via INTRAVENOUS
  Filled 2023-04-12: qty 1

## 2023-04-12 MED ORDER — KETOROLAC TROMETHAMINE 30 MG/ML IJ SOLN: 30.0000 mg | Freq: Once | INTRAMUSCULAR | Status: DC

## 2023-04-12 MED ORDER — METHOCARBAMOL 500 MG PO TABS: 500.0000 mg | ORAL_TABLET | Freq: Two times a day (BID) | ORAL | 0 refills | Status: AC

## 2023-04-12 MED ORDER — OXYCODONE-ACETAMINOPHEN 5-325 MG PO TABS: 1.0000 | ORAL_TABLET | Freq: Four times a day (QID) | ORAL | 0 refills | Status: AC | PRN

## 2023-04-12 NOTE — ED Notes (Signed)
Pt did well ambulating. Pt did complain of feeling dizzy. It felt like she was the one spinning. We ambulated to the RR she stated she didn't need help in RR and pulled bell when finished and she did well ambulating back to room.
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
## 2023-04-12 NOTE — Discharge Instructions (Signed)
Please use Tylenol or ibuprofen for pain.  You may use 600 mg ibuprofen every 6 hours or 1000 mg of Tylenol every 6 hours.  You may choose to alternate between the 2.  This would be most effective.  Not to exceed 4 g of Tylenol within 24 hours.  Not to exceed 3200 mg ibuprofen 24 hours.  You can use the muscle relaxer in addition to the above up to twice daily, please note that it is somewhat sedating.  You can also take the stronger pain medication in place of Tylenol above for severe breakthrough pain.  Please follow-up closely with the orthopedic physician's contact formation provided, return to the emergency department if you have severe worsening pain despite treatment, and follow-up with your primary care doctor for overall recheck.  The collar that you are wearing is not permanent, you can wear it for comfort for 1 to 2 weeks, and then take it off as tolerated.  Again I recommend that you follow-up closely with the orthopedic physician's contact formation I provided to recheck your symptoms and ensure that your pain is improving.
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
## 2023-05-19 ENCOUNTER — Ambulatory Visit: Payer: Medicaid Other | Attending: Physician Assistant | Admitting: Physical Therapy

## 2023-05-19 ENCOUNTER — Encounter: Payer: Self-pay | Admitting: Physical Therapy

## 2023-05-19 DIAGNOSIS — M542 Cervicalgia: Secondary | ICD-10-CM | POA: Diagnosis present

## 2023-05-19 DIAGNOSIS — R252 Cramp and spasm: Secondary | ICD-10-CM | POA: Insufficient documentation

## 2023-05-19 DIAGNOSIS — M5459 Other low back pain: Secondary | ICD-10-CM | POA: Diagnosis present
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
## 2023-05-21 ENCOUNTER — Encounter: Payer: Self-pay | Admitting: Physical Therapy

## 2023-05-21 ENCOUNTER — Ambulatory Visit: Payer: Medicaid Other | Admitting: Physical Therapy

## 2023-05-21 DIAGNOSIS — R252 Cramp and spasm: Secondary | ICD-10-CM

## 2023-05-21 DIAGNOSIS — M5459 Other low back pain: Secondary | ICD-10-CM

## 2023-05-21 DIAGNOSIS — M542 Cervicalgia: Secondary | ICD-10-CM | POA: Diagnosis not present
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
## 2023-05-26 ENCOUNTER — Ambulatory Visit: Payer: Medicaid Other | Attending: Physician Assistant | Admitting: Physical Therapy

## 2023-05-26 DIAGNOSIS — M542 Cervicalgia: Secondary | ICD-10-CM | POA: Insufficient documentation

## 2023-05-26 DIAGNOSIS — M5459 Other low back pain: Secondary | ICD-10-CM | POA: Insufficient documentation

## 2023-05-26 DIAGNOSIS — R252 Cramp and spasm: Secondary | ICD-10-CM | POA: Insufficient documentation

## 2023-05-26 NOTE — Therapy (Signed)
 OUTPATIENT PHYSICAL THERAPY CERVICAL    Patient Name: Ashley Pittman MRN: [id number] DOB:[birth year], 43 y.o., female Today's Date: 05/26/2023  END OF SESSION:  PT End of Session - 05/26/23 0840     Visit Number 3    Number of Visits 15    Date for PT Re-Evaluation 08/16/23    Authorization Type [employer]    PT Start Time 0845    PT Stop Time 0925    PT Time Calculation (min) 40 min             No past medical history on file. No past surgical history on file. Patient Active Problem List   Diagnosis Date Noted   DEPRESSION, MAJOR 03/29/2010   LOSS OF APPETITE 03/29/2010   BREAST PAIN, BILATERAL 03/26/2009    PCP: Vincent Peyer, PA  REFERRING PROVIDER: Chadwell, PA  REFERRING DIAG: Cervicalgia  THERAPY DIAG:  Cervicalgia  Cramp and spasm  Other low back pain  Rationale for Evaluation and Treatment: Rehabilitation  ONSET DATE: 04/11/23  SUBJECTIVE:                                                                                                                                                                                                         SUBJECTIVE STATEMENT: Rt side of neck feels better since injection ,left side painful. LBP some pain from time to time- no issues currently  PERTINENT HISTORY:  none  PAIN:  Are you having pain? 4/10 left cervial  PRECAUTIONS: None  RED FLAGS: None     WEIGHT BEARING RESTRICTIONS: No  FALLS:  Has patient fallen in last 6 months? No  LIVING ENVIRONMENT: Lives with: lives with their family Lives in: House/apartment Stairs: Yes: Internal: 15 steps; can reach both Has following equipment at home: None  OCCUPATION: Cashier, lifting objects  PLOF: Independent and did house work, did some yardwork, some walking  PATIENT GOALS: have less pain, move better  NEXT MD VISIT: unsure  OBJECTIVE:  Note: Objective measures were completed at Evaluation unless otherwise noted.  DIAGNOSTIC FINDINGS:     IMPRESSION: 1. No occult fracture or soft tissue injury.  No cord contusion. 2. 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 An extrusion at C5-6 contacts the ventral cord without compression.    PATIENT SURVEYS:  NDI 35/50 = 70%  COGNITION: Overall cognitive status: Within functional limits for tasks assessed  SENSATION: WFL  POSTURE: rounded shoulders and forward head  PALPATION: Very tight and tender in the neck, upper traps, the rhomboids and the lumbar area   CERVICAL  ROM:   Active ROM A/PROM (deg) eval  Flexion Decreased 50%  Extension Decreased 50%  Right lateral flexion Decreased 75%  Left lateral flexion Decreased 75%  Right rotation Decreased 50%  Left rotation Decreased 50%   (Blank rows = not tested)  LUMBAR ROM decreased 75% with pain for all motions  UPPER EXTREMITY ROM:  Active ROM Right eval Left eval  Shoulder flexion 130 130  Shoulder extension    Shoulder abduction    Shoulder adduction    Shoulder extension    Shoulder internal rotation 50 50  Shoulder external rotation 60 60  Elbow flexion    Elbow extension    Wrist flexion    Wrist extension    Wrist ulnar deviation    Wrist radial deviation    Wrist pronation    Wrist supination     (Blank rows = not tested)  UPPER EXTREMITY MMT:  all increase pain in the neck and shoulders  MMT Right eval Left eval  Shoulder flexion 3+ 3+  Shoulder extension    Shoulder abduction    Shoulder adduction    Shoulder extension    Shoulder internal rotation 4- 4-  Shoulder external rotation 3+ 3+  Middle trapezius    Lower trapezius    Elbow flexion    Elbow extension    Wrist flexion    Wrist extension    Wrist ulnar deviation    Wrist radial deviation    Wrist pronation    Wrist supination    Grip strength     (Blank rows = not tested)  CERVICAL SPECIAL TESTS:  All cervical motions increase pain  FUNCTIONAL TESTS:  Timed up and go (TUG): 31 seconds  TREATMENT DATE:    05/26/23 Nustep L 4 - c/o some RT LE discomfort Yellow tband shld ext ,rows and ER 12 x each- postural cuing needed as sh etends to compensate ,c/o discomfort Head on ball on wall cerv retraction 12 x hold 3 sec 4# shld shruggs and backward rolls 12 x each Feet on ball K2C, rotation, small bridge, iso abs, KTC Passive LE stretches Red tband clamshells and hip flexion 2 sets 10 Ball b/n knees squeeze 15 x hold 3 sec STM to the neck, upper trap and rhomboids with Pass ROM/stretching            05/21/23  Nustep level 3 x 5 minutes  Yellow tband row  Yellow tband extension  Feet on ball K2C, rotation, small bridge, iso abs Passive LE stretches Red tband clamshells in hooklying Ball b/n knees squeeze Shrugs and shoulder rolls STM to the neck, upper trap and rhomboids                                                                                                                PATIENT EDUCATION:  Education details: HEP/POC Person educated: Patient Education method: Programmer, multimedia, Facilities manager, Verbal cues, and Handouts Education comprehension: verbalized understanding  HOME EXERCISE PROGRAM: Access Code: [email or web address] URL: [email or web address] Date: 05/19/2023 Prepared by: Stacie Glaze  Exercises - Hooklying Single Knee to Chest Stretch  - 2 x daily - 7 x weekly - 1 sets - 10 reps - 5 hold - Supine Double Knee to Chest  - 2 x daily - 7 x weekly - 1 sets - 10 reps - 5 hold - Seated Shoulder Shrugs  - 2 x daily - 7 x weekly - 1 sets - 10 reps - 3 hold - Seated Scapular Retraction  - 2 x daily - 7 x weekly - 1 sets - 10 reps - 3 hold - Seated Passive Cervical Retraction  - 1 x daily - 7 x weekly - 1 sets - 10 reps - 3 hold  ASSESSMENT:  CLINICAL IMPRESSION: verb doing HEP without issue, STG met. Progressed ex- pt continues to be guarded and needs cuing to decrease compensations. Visible LE shaking with supine ex   OBJECTIVE IMPAIRMENTS: cardiopulmonary status  limiting activity, decreased activity tolerance, decreased endurance, decreased mobility, decreased ROM, decreased strength, increased fascial restrictions, increased muscle spasms, impaired flexibility, impaired UE functional use, improper body mechanics, postural dysfunction, and pain.   REHAB POTENTIAL: Good  CLINICAL DECISION MAKING: Evolving/moderate complexity  EVALUATION COMPLEXITY: Moderate   GOALS: Goals reviewed with patient? Yes  SHORT TERM GOALS: Target date: 06/05/23  Independent with initial HEP Baseline:  Goal status: 05/26/23 MET  LONG TERM GOALS: Target date: 08/16/23  Understand posture and body mechanics Baseline:  Goal status: INITIAL  2.  Increase cervical ROM 25% Baseline:  Goal status: INITIAL  3.  Increase lumbar ROM 25% Baseline:  Goal status: INITIAL  4.  Decrease NDI from 70% to 40% Baseline:  Goal status: INITIAL  5.  Report pain overall decreased 50% Baseline:  Goal status: INITIAL  PLAN:  PT FREQUENCY: 1-2x/week  PT DURATION: 12 weeks  PLANNED INTERVENTIONS: 97164- PT Re-evaluation, 97110-Therapeutic exercises, 97530- Therapeutic activity, 97112- Neuromuscular re-education, 97535- Self Care, 96295- Manual therapy, G0283- Electrical stimulation (unattended), 97035- Ultrasound, 28413- Traction (mechanical), Patient/Family education, Taping, Dry Needling, Joint mobilization, Cryotherapy, and Moist heat  PLAN FOR NEXT SESSION: assess and progress    Zavier Canela,ANGIE, PTA 05/26/2023, 9:07 AM     [facility] [address] [facility] [place], [place], [address] Phone: [phone]   Fax:  [phone]  Patient Details  Name: Ashley Pittman MRN: [id number] Date of Birth: 05-Nov-1980 Referring Provider:  Margart Sickles, PA-C  Encounter Date: 05/26/2023   Suanne Marker, PTA 05/26/2023, 9:07 AM  [facility] [address] [facility] [place], [place], [address] Phone: [phone]   Fax:  [phone]

## 2023-05-28 ENCOUNTER — Ambulatory Visit: Payer: Medicaid Other | Admitting: Physical Therapy

## 2023-05-28 DIAGNOSIS — M5459 Other low back pain: Secondary | ICD-10-CM

## 2023-05-28 DIAGNOSIS — M542 Cervicalgia: Secondary | ICD-10-CM | POA: Diagnosis not present

## 2023-05-28 DIAGNOSIS — R252 Cramp and spasm: Secondary | ICD-10-CM

## 2023-05-28 NOTE — Therapy (Signed)
 OUTPATIENT PHYSICAL THERAPY CERVICAL    Patient Name: Ashley Pittman MRN: [id number] DOB:24-Oct-1980, 43 y.o., female Today's Date: 05/28/2023  END OF SESSION:  PT End of Session - 05/28/23 0758     Visit Number 4    Number of Visits 15    Date for PT Re-Evaluation 08/16/23    Authorization Type [employer]    PT Start Time 0800    PT Stop Time 0845    PT Time Calculation (min) 45 min             No past medical history on file. No past surgical history on file. Patient Active Problem List   Diagnosis Date Noted   DEPRESSION, MAJOR 03/29/2010   LOSS OF APPETITE 03/29/2010   BREAST PAIN, BILATERAL 03/26/2009    PCP: Vincent Peyer, PA  REFERRING PROVIDER: Chadwell, PA  REFERRING DIAG: Cervicalgia  THERAPY DIAG:  Cervicalgia  Cramp and spasm  Other low back pain  Rationale for Evaluation and Treatment: Rehabilitation  ONSET DATE: 04/11/23  SUBJECTIVE:                                                                                                                                                                                                         SUBJECTIVE STATEMENT: Neck feels better like a 2. Walked yesterday and doing better but RT leg gets tired easy and feels like it might give out esp going down inclines when walking  PERTINENT HISTORY:  none  PAIN:  Are you having pain? 2/10 left cervial  PRECAUTIONS: None  RED FLAGS: None     WEIGHT BEARING RESTRICTIONS: No  FALLS:  Has patient fallen in last 6 months? No  LIVING ENVIRONMENT: Lives with: lives with their family Lives in: House/apartment Stairs: Yes: Internal: 15 steps; can reach both Has following equipment at home: None  OCCUPATION: Cashier, lifting objects  PLOF: Independent and did house work, did some yardwork, some walking  PATIENT GOALS: have less pain, move better  NEXT MD VISIT: unsure  OBJECTIVE:  Note: Objective measures were completed at Evaluation unless otherwise  noted.  DIAGNOSTIC FINDINGS:    IMPRESSION: 1. No occult fracture or soft tissue injury.  No cord contusion. 2. 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 An extrusion at C5-6 contacts the ventral cord without compression.    PATIENT SURVEYS:  NDI 35/50 = 70%  COGNITION: Overall cognitive status: Within functional limits for tasks assessed  SENSATION: WFL  POSTURE: rounded shoulders and forward head  PALPATION: Very tight and tender in the neck, upper traps,  the rhomboids and the lumbar area   CERVICAL ROM:   Active ROM A/PROM (deg) eval  Flexion Decreased 50%  Extension Decreased 50%  Right lateral flexion Decreased 75%  Left lateral flexion Decreased 75%  Right rotation Decreased 50%  Left rotation Decreased 50%   (Blank rows = not tested)  LUMBAR ROM decreased 75% with pain for all motions  UPPER EXTREMITY ROM:  Active ROM Right eval Left eval  Shoulder flexion 130 130  Shoulder extension    Shoulder abduction    Shoulder adduction    Shoulder extension    Shoulder internal rotation 50 50  Shoulder external rotation 60 60  Elbow flexion    Elbow extension    Wrist flexion    Wrist extension    Wrist ulnar deviation    Wrist radial deviation    Wrist pronation    Wrist supination     (Blank rows = not tested)  UPPER EXTREMITY MMT:  all increase pain in the neck and shoulders  MMT Right eval Left eval  Shoulder flexion 3+ 3+  Shoulder extension    Shoulder abduction    Shoulder adduction    Shoulder extension    Shoulder internal rotation 4- 4-  Shoulder external rotation 3+ 3+  Middle trapezius    Lower trapezius    Elbow flexion    Elbow extension    Wrist flexion    Wrist extension    Wrist ulnar deviation    Wrist radial deviation    Wrist pronation    Wrist supination    Grip strength     (Blank rows = not tested)  CERVICAL SPECIAL TESTS:  All cervical motions increase pain  FUNCTIONAL TESTS:  Timed up and go  (TUG): 31 seconds  TREATMENT DATE:   05/28/23 Nustep L 4  Resisted Gait 20# 3 x 4 way Black Bar heel raise/toe raise 2 sets 10 each Cable pulley row 2 sets 10  10# Cable pulley shld ext 2 sets 10  5# HS curl 10# 2 sets 10, RT LE 5# 10 x- multiu wt adjustments d/t weakness Knee ext 10# 2 sets 5. 5# RT LE only 2 sets 5- smaller reps d/t fatigue Leg Press 20# 2 sets 10 Head on ball on wall cerv retraction 12 x hold 3 sec 4# shld shruggs and backward rolls 12 x each STS yellow ball chest press 10 x PROM BIL LE and trunl Supine cerv PROM and STW   05/26/23 Nustep L 4 - c/o some RT LE discomfort Yellow tband shld ext ,rows and ER 12 x each- postural cuing needed as sh etends to compensate ,c/o discomfort Head on ball on wall cerv retraction 12 x hold 3 sec 4# shld shruggs and backward rolls 12 x each Feet on ball K2C, rotation, small bridge, iso abs, KTC Passive LE stretches Red tband clamshells and hip flexion 2 sets 10 Ball b/n knees squeeze 15 x hold 3 sec STM to the neck, upper trap and rhomboids with Pass ROM/stretching            05/21/23  Nustep level 3 x 5 minutes  Yellow tband row  Yellow tband extension  Feet on ball K2C, rotation, small bridge, iso abs Passive LE stretches Red tband clamshells in hooklying Ball b/n knees squeeze Shrugs and shoulder rolls STM to the neck, upper trap and rhomboids  PATIENT EDUCATION:  Education details: HEP/POC Person educated: Patient Education method: Programmer, multimedia, Facilities manager, Verbal cues, and Handouts Education comprehension: verbalized understanding  HOME EXERCISE PROGRAM: Access Code: [email or web address] URL: [email or web address] Date: 05/19/2023 Prepared by: Stacie Glaze  Exercises - Hooklying Single Knee to Chest Stretch  - 2 x daily - 7 x weekly - 1 sets - 10 reps - 5 hold - Supine Double  Knee to Chest  - 2 x daily - 7 x weekly - 1 sets - 10 reps - 5 hold - Seated Shoulder Shrugs  - 2 x daily - 7 x weekly - 1 sets - 10 reps - 3 hold - Seated Scapular Retraction  - 2 x daily - 7 x weekly - 1 sets - 10 reps - 3 hold - Seated Passive Cervical Retraction  - 1 x daily - 7 x weekly - 1 sets - 10 reps - 3 hold  ASSESSMENT:  CLINICAL IMPRESSION: pt arrived feeling better, chief compliant with RT LE weakness so we focused on LE strengthening with cuing needed and visible fatigue and shaking noted OBJECTIVE IMPAIRMENTS: cardiopulmonary status limiting activity, decreased activity tolerance, decreased endurance, decreased mobility, decreased ROM, decreased strength, increased fascial restrictions, increased muscle spasms, impaired flexibility, impaired UE functional use, improper body mechanics, postural dysfunction, and pain.   REHAB POTENTIAL: Good  CLINICAL DECISION MAKING: Evolving/moderate complexity  EVALUATION COMPLEXITY: Moderate   GOALS: Goals reviewed with patient? Yes  SHORT TERM GOALS: Target date: 06/05/23  Independent with initial HEP Baseline:  Goal status: 05/26/23 MET  LONG TERM GOALS: Target date: 08/16/23  Understand posture and body mechanics Baseline:  Goal status: 05/28/23 ongoing  2.  Increase cervical ROM 25% Baseline:  Goal status: INITIAL  3.  Increase lumbar ROM 25% Baseline:  Goal status: INITIAL  4.  Decrease NDI from 70% to 40% Baseline:  Goal status: INITIAL  5.  Report pain overall decreased 50% Baseline:  Goal status: progressing 05/28/23  PLAN:  PT FREQUENCY: 1-2x/week  PT DURATION: 12 weeks  PLANNED INTERVENTIONS: 97164- PT Re-evaluation, 97110-Therapeutic exercises, 97530- Therapeutic activity, 97112- Neuromuscular re-education, 97535- Self Care, 16109- Manual therapy, G0283- Electrical stimulation (unattended), 97035- Ultrasound, 60454- Traction (mechanical), Patient/Family education, Taping, Dry Needling, Joint mobilization,  Cryotherapy, and Moist heat  PLAN FOR NEXT SESSION: continue to progress strength,func and ROM   Aubrielle Stroud,ANGIE, PTA 05/28/2023, 7:59 AM     [facility] Rehabilitation at [facility] [address] [facility]. [place], [place], [address] Phone: [phone]   Fax:  [phone]  Patient Details  Name: GILMA BESSETTE MRN: [id number] Date of Birth: December 28, 1980 Referring Provider:  Margart Sickles, PA-C  Encounter Date: 05/28/2023   Suanne Marker, PTA 05/28/2023, 7:59 AM  [place] [facility] at [facility] [address] [facility]. [place], [place], [address] Phone: [phone]   Fax:  [phone]  Outpatient Rehabilitation at [facility] [address] [facility] [address] [employer] [place], [place], [address] Phone: [phone]   Fax:  [phone]

## 2023-06-02 ENCOUNTER — Ambulatory Visit: Payer: Medicaid Other | Admitting: Physical Therapy

## 2023-06-02 DIAGNOSIS — M542 Cervicalgia: Secondary | ICD-10-CM | POA: Diagnosis not present

## 2023-06-02 DIAGNOSIS — M5459 Other low back pain: Secondary | ICD-10-CM

## 2023-06-02 DIAGNOSIS — R252 Cramp and spasm: Secondary | ICD-10-CM

## 2023-06-02 NOTE — Therapy (Signed)
 OUTPATIENT PHYSICAL THERAPY CERVICAL    Patient Name: Ashley Pittman MRN: [id number] DOB:[birth year], 43 y.o., female Today's Date: 06/02/2023  END OF SESSION:  PT End of Session - 06/02/23 1145     Visit Number 5    Number of Visits 15    Date for PT Re-Evaluation 08/16/23    Authorization Type [employer]    PT Start Time 1145    PT Stop Time 1230    PT Time Calculation (min) 45 min             No past medical history on file. No past surgical history on file. Patient Active Problem List   Diagnosis Date Noted   DEPRESSION, MAJOR 03/29/2010   LOSS OF APPETITE 03/29/2010   BREAST PAIN, BILATERAL 03/26/2009    PCP: Vincent Peyer, PA  REFERRING PROVIDER: Chadwell, PA  REFERRING DIAG: Cervicalgia  THERAPY DIAG:  Cervicalgia  Cramp and spasm  Other low back pain  Rationale for Evaluation and Treatment: Rehabilitation  ONSET DATE: 04/11/23  SUBJECTIVE:                                                                                                                                                                                                         SUBJECTIVE STATEMENT: Overall feeling better,neck is better. RT leg still weak   PERTINENT HISTORY:  none  PAIN:  Are you having pain? 2/10 left cervial  PRECAUTIONS: None  RED FLAGS: None     WEIGHT BEARING RESTRICTIONS: No  FALLS:  Has patient fallen in last 6 months? No  LIVING ENVIRONMENT: Lives with: lives with their family Lives in: House/apartment Stairs: Yes: Internal: 15 steps; can reach both Has following equipment at home: None  OCCUPATION: Cashier, lifting objects  PLOF: Independent and did house work, did some yardwork, some walking  PATIENT GOALS: have less pain, move better  NEXT MD VISIT: unsure  OBJECTIVE:  Note: Objective measures were completed at Evaluation unless otherwise noted.  DIAGNOSTIC FINDINGS:    IMPRESSION: 1. No occult fracture or soft tissue injury.  No cord  contusion. 2. 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 An extrusion at C5-6 contacts the ventral cord without compression.    PATIENT SURVEYS:  NDI 35/50 = 70%  COGNITION: Overall cognitive status: Within functional limits for tasks assessed  SENSATION: WFL  POSTURE: rounded shoulders and forward head  PALPATION: Very tight and tender in the neck, upper traps, the rhomboids and the lumbar area   CERVICAL ROM:   Active ROM A/PROM (deg) eval Act 06/02/23  Flexion Decreased 50% WFLS  Extension Decreased 50% WFLs  Right lateral flexion Decreased 75% WFLs  Left lateral flexion Decreased 75% WFLs  Right rotation Decreased 50% WFLs  Left rotation Decreased 50% WFLs   (Blank rows = not tested)  LUMBAR ROM decreased 75% with pain for all motions  UPPER EXTREMITY ROM:  Active ROM Right eval Left eval RT/Left 06/02/23  Shoulder flexion 130 130 165/160  Shoulder extension     Shoulder abduction     Shoulder adduction     Shoulder extension     Shoulder internal rotation 50 50 WFLs  Shoulder external rotation 60 60 WFLs  Elbow flexion     Elbow extension     Wrist flexion     Wrist extension     Wrist ulnar deviation     Wrist radial deviation     Wrist pronation     Wrist supination      (Blank rows = not tested)  UPPER EXTREMITY MMT:  all increase pain in the neck and shoulders  MMT Right eval Left eval RT/Left 06/02/23  Shoulder flexion 3+ 3+ 4/5  Shoulder extension     Shoulder abduction     Shoulder adduction     Shoulder extension     Shoulder internal rotation 4- 4- 4+/5  Shoulder external rotation 3+ 3+ 4/5  Middle trapezius     Lower trapezius     Elbow flexion     Elbow extension     Wrist flexion     Wrist extension     Wrist ulnar deviation     Wrist radial deviation     Wrist pronation     Wrist supination     Grip strength      (Blank rows = not tested)  CERVICAL SPECIAL TESTS:  All cervical motions increase  pain  FUNCTIONAL TESTS:  Timed up and go (TUG): 31 seconds  TREATMENT DATE:   06/02/23 Assessed and documented in charts above ROM and MMT. Lumbar ROM WFLs UBE L 3 2 min fwd/2 min backward Bike L 4 6 min Lat Pull 2 sets 10 15# Seated Row 2 sets 10 15# Black tband back ext 2 sets 10 HS curl 2 sets 10 green tband Knee ext 3# 2 sets 10 Leg Press 20# 2 sets 12 Standing 3# ankle wts alt LE 20 x marching,SL hip flex,ext and abd 2# UE circuit   05/28/23 Nustep L 4  Resisted Gait 20# 3 x 4 way Black Bar heel raise/toe raise 2 sets 10 each Cable pulley row 2 sets 10  10# Cable pulley shld ext 2 sets 10  5# HS curl 10# 2 sets 10, RT LE 5# 10 x- multiu wt adjustments d/t weakness Knee ext 10# 2 sets 5. 5# RT LE only 2 sets 5- smaller reps d/t fatigue Leg Press 20# 2 sets 10 Head on ball on wall cerv retraction 12 x hold 3 sec 4# shld shruggs and backward rolls 12 x each STS yellow ball chest press 10 x PROM BIL LE and trunl Supine cerv PROM and STW   05/26/23 Nustep L 4 - c/o some RT LE discomfort Yellow tband shld ext ,rows and ER 12 x each- postural cuing needed as sh etends to compensate ,c/o discomfort Head on ball on wall cerv retraction 12 x hold 3 sec 4# shld shruggs and backward rolls 12 x each Feet on ball K2C, rotation, small bridge, iso abs, KTC Passive LE stretches Red tband clamshells  and hip flexion 2 sets 10 Ball b/n knees squeeze 15 x hold 3 sec STM to the neck, upper trap and rhomboids with Pass ROM/stretching            05/21/23  Nustep level 3 x 5 minutes  Yellow tband row  Yellow tband extension  Feet on ball K2C, rotation, small bridge, iso abs Passive LE stretches Red tband clamshells in hooklying Ball b/n knees squeeze Shrugs and shoulder rolls STM to the neck, upper trap and rhomboids                                                                                                                PATIENT EDUCATION:  Education details:  HEP/POC Person educated: Patient Education method: Programmer, multimedia, Facilities manager, Verbal cues, and Handouts Education comprehension: verbalized understanding  HOME EXERCISE PROGRAM: Access Code: [email or web address] URL: [email or web address] Date: 05/19/2023 Prepared by: Stacie Glaze  Exercises - Hooklying Single Knee to Chest Stretch  - 2 x daily - 7 x weekly - 1 sets - 10 reps - 5 hold - Supine Double Knee to Chest  - 2 x daily - 7 x weekly - 1 sets - 10 reps - 5 hold - Seated Shoulder Shrugs  - 2 x daily - 7 x weekly - 1 sets - 10 reps - 3 hold - Seated Scapular Retraction  - 2 x daily - 7 x weekly - 1 sets - 10 reps - 3 hold - Seated Passive Cervical Retraction  - 1 x daily - 7 x weekly - 1 sets - 10 reps - 3 hold  ASSESSMENT:  CLINICAL IMPRESSION: pt arrived feeling 90% better overall all. Still c/o minimal RT cerv pain and rt shld and RT leg weakness and still issues with sleeping on RT side. Assesses and documented ROM and MMT. Housework and standing is hard after 20 minutes. C/o fatigue and shaking in RT UE and LE with ex.Verb and tactile cuing needed with there ex.Continue to progress strength and function.   OBJECTIVE IMPAIRMENTS: cardiopulmonary status limiting activity, decreased activity tolerance, decreased endurance, decreased mobility, decreased ROM, decreased strength, increased fascial restrictions, increased muscle spasms, impaired flexibility, impaired UE functional use, improper body mechanics, postural dysfunction, and pain.   REHAB POTENTIAL: Good  CLINICAL DECISION MAKING: Evolving/moderate complexity  EVALUATION COMPLEXITY: Moderate   GOALS: Goals reviewed with patient? Yes  SHORT TERM GOALS: Target date: 06/05/23  Independent with initial HEP Baseline:  Goal status: 05/26/23 MET  LONG TERM GOALS: Target date: 08/16/23  Understand posture and body mechanics Baseline:  Goal status: 05/28/23 ongoing  2.  Increase cervical ROM 25% Baseline:  Goal  status: 06/02/23 MET  3.  Increase lumbar ROM 25% Baseline:  Goal status: 06/02/23 MET  4.  Decrease NDI from 70% to 40% Baseline:  Goal status: INITIAL  5.  Report pain overall decreased 50% Baseline:  Goal status: progressing 05/28/23     PLAN:  PT FREQUENCY: 1-2x/week  PT DURATION: 12 weeks  PLANNED INTERVENTIONS: 97164- PT Re-evaluation, 97110-Therapeutic exercises, 97530- Therapeutic  activity, [id number]- Neuromuscular re-education, [id number]- Self Care, 16109- Manual therapy, G0283- Electrical stimulation (unattended), 97035- Ultrasound, 60454- Traction (mechanical), Patient/Family education, Taping, Dry Needling, Joint mobilization, Cryotherapy, and Moist heat  PLAN FOR NEXT SESSION: continue to progress strength,func and ROM. MD 3/12   Macky Galik,ANGIE, PTA 06/02/2023, 11:45 AM     [facility] Outpatient Rehabilitation at [facility] [address] [facility] [place], [place], [address] Phone: [phone]   Fax:  [phone]  Patient Details  Name: ARYIANNA EARWOOD MRN: [id number] Date of Birth: 1980/04/06 Referring Provider:  Margart Sickles, PA-C  Encounter Date: 06/02/2023   Suanne Marker, PTA 06/02/2023, 11:45 AM  [place] [facility] at [facility] [address] [facility]. [place], [place], [address] Phone: [phone]   Fax:  [phone] at [facility] [address] [facility] [address]. [place], [place], [address] Phone: [phone]   Fax:  [phone] [facility] Outpatient Rehabilitation at [facility] [address] [place], [place], [address] Phone: [phone]   Fax:  [phone]

## 2023-06-04 ENCOUNTER — Encounter: Payer: Self-pay | Admitting: Physical Therapy

## 2023-06-04 ENCOUNTER — Ambulatory Visit: Payer: Medicaid Other | Admitting: Physical Therapy

## 2023-06-04 DIAGNOSIS — M542 Cervicalgia: Secondary | ICD-10-CM

## 2023-06-04 DIAGNOSIS — M5459 Other low back pain: Secondary | ICD-10-CM

## 2023-06-04 DIAGNOSIS — R252 Cramp and spasm: Secondary | ICD-10-CM

## 2023-06-04 NOTE — Therapy (Signed)
 OUTPATIENT PHYSICAL THERAPY CERVICAL    Patient Name: Ashley Pittman MRN: [id number] DOB:[birth year], 43 y.o., female Today's Date: 06/04/2023  END OF SESSION:  PT End of Session - 06/04/23 0850     Visit Number 6    Number of Visits 15    Date for PT Re-Evaluation 08/16/23    Authorization Type [employer]    PT Start Time [phone]    PT Stop Time 0930    PT Time Calculation (min) 44 min    Activity Tolerance Patient tolerated treatment well    Behavior During Therapy [facility] for tasks assessed/performed             History reviewed. No pertinent past medical history. History reviewed. No pertinent surgical history. Patient Active Problem List   Diagnosis Date Noted   DEPRESSION, MAJOR 03/29/2010   LOSS OF APPETITE 03/29/2010   BREAST PAIN, BILATERAL 03/26/2009    PCP: Vincent Peyer, PA  REFERRING PROVIDER: Chadwell, PA  REFERRING DIAG: Cervicalgia  THERAPY DIAG:  Cervicalgia  Cramp and spasm  Other low back pain  Rationale for Evaluation and Treatment: Rehabilitation  ONSET DATE: 04/11/23  SUBJECTIVE:                                                                                                                                                                                                         SUBJECTIVE STATEMENT: I am feeling better, still feel weak in the legs and the right arm, some neck pain but better   PERTINENT HISTORY:  none  PAIN:  Are you having pain? 2/10 left cervial  PRECAUTIONS: None  RED FLAGS: None     WEIGHT BEARING RESTRICTIONS: No  FALLS:  Has patient fallen in last 6 months? No  LIVING ENVIRONMENT: Lives with: lives with their family Lives in: House/apartment Stairs: Yes: Internal: 15 steps; can reach both Has following equipment at home: None  OCCUPATION: Cashier, lifting objects  PLOF: Independent and did house work, did some yardwork, some walking  PATIENT GOALS: have less pain, move better  NEXT MD VISIT:  unsure  OBJECTIVE:  Note: Objective measures were completed at Evaluation unless otherwise noted.  DIAGNOSTIC FINDINGS:    IMPRESSION: 1. No occult fracture or soft tissue injury.  No cord contusion. 2. 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 An extrusion at C5-6 contacts the ventral cord without compression.    PATIENT SURVEYS:  NDI 35/50 = 70%  COGNITION: Overall cognitive status: Within functional limits for tasks assessed  SENSATION: WFL  POSTURE: rounded shoulders and forward head  PALPATION: Very tight and tender in the neck, upper traps, the rhomboids and the lumbar area   CERVICAL ROM:   Active ROM A/PROM (deg) eval Act 06/02/23   Flexion Decreased 50% WFLS  Extension Decreased 50% WFLs  Right lateral flexion Decreased 75% WFLs  Left lateral flexion Decreased 75% WFLs  Right rotation Decreased 50% WFLs  Left rotation Decreased 50% WFLs   (Blank rows = not tested)  LUMBAR ROM decreased 75% with pain for all motions  UPPER EXTREMITY ROM:  Active ROM Right eval Left eval RT/Left 06/02/23  Shoulder flexion 130 130 165/160  Shoulder extension     Shoulder abduction     Shoulder adduction     Shoulder extension     Shoulder internal rotation 50 50 WFLs  Shoulder external rotation 60 60 WFLs  Elbow flexion     Elbow extension     Wrist flexion     Wrist extension     Wrist ulnar deviation     Wrist radial deviation     Wrist pronation     Wrist supination      (Blank rows = not tested)  UPPER EXTREMITY MMT:  all increase pain in the neck and shoulders  MMT Right eval Left eval RT/Left 06/02/23  Shoulder flexion 3+ 3+ 4/5  Shoulder extension     Shoulder abduction     Shoulder adduction     Shoulder extension     Shoulder internal rotation 4- 4- 4+/5  Shoulder external rotation 3+ 3+ 4/5  Middle trapezius     Lower trapezius     Elbow flexion     Elbow extension     Wrist flexion     Wrist extension     Wrist ulnar deviation      Wrist radial deviation     Wrist pronation     Wrist supination     Grip strength      (Blank rows = not tested)  CERVICAL SPECIAL TESTS:  All cervical motions increase pain  FUNCTIONAL TESTS:  Timed up and go (TUG): 31 seconds  TREATMENT DATE:  06/04/23 Bike level 3 x 4 minutes Gait around the back building trying to maintain good speed, had a slow pace and a limp on the right especially going down the slope HS curls 15# 2x10 Leg extension 5# 2x10 5# straight arm pulls cues for core and posture Seated row 15# 2x10 Lats 15# 2x10 Slant board calf stretch Leg press 20# 2x10 3# hip extension and abduction  06/02/23 Assessed and documented in charts above ROM and MMT. Lumbar ROM WFLs UBE L 3 2 min fwd/2 min backward Bike L 4 6 min Lat Pull 2 sets 10 15# Seated Row 2 sets 10 15# Black tband back ext 2 sets 10 HS curl 2 sets 10 green tband Knee ext 3# 2 sets 10 Leg Press 20# 2 sets 12 Standing 3# ankle wts alt LE 20 x marching,SL hip flex,ext and abd 2# UE circuit   05/28/23 Nustep L 4  Resisted Gait 20# 3 x 4 way Black Bar heel raise/toe raise 2 sets 10 each Cable pulley row 2 sets 10  10# Cable pulley shld ext 2 sets 10  5# HS curl 10# 2 sets 10, RT LE 5# 10 x- multiu wt adjustments d/t weakness Knee ext 10# 2 sets 5. 5# RT LE only 2 sets 5- smaller reps d/t fatigue Leg Press 20# 2 sets 10 Head on ball on wall cerv retraction  12 x hold 3 sec 4# shld shruggs and backward rolls 12 x each STS yellow ball chest press 10 x PROM BIL LE and trunl Supine cerv PROM and STW   05/26/23 Nustep L 4 - c/o some RT LE discomfort Yellow tband shld ext ,rows and ER 12 x each- postural cuing needed as sh etends to compensate ,c/o discomfort Head on ball on wall cerv retraction 12 x hold 3 sec 4# shld shruggs and backward rolls 12 x each Feet on ball K2C, rotation, small bridge, iso abs, KTC Passive LE stretches Red tband clamshells and hip flexion 2 sets 10 Ball b/n  knees squeeze 15 x hold 3 sec STM to the neck, upper trap and rhomboids with Pass ROM/stretching            05/21/23  Nustep level 3 x 5 minutes  Yellow tband row  Yellow tband extension  Feet on ball K2C, rotation, small bridge, iso abs Passive LE stretches Red tband clamshells in hooklying Ball b/n knees squeeze Shrugs and shoulder rolls STM to the neck, upper trap and rhomboids                                                                                                                PATIENT EDUCATION:  Education details: HEP/POC Person educated: Patient Education method: Programmer, multimedia, Facilities manager, Verbal cues, and Handouts Education comprehension: verbalized understanding  HOME EXERCISE PROGRAM: Access Code: [email or web address] URL: [email or web address] Date: 05/19/2023 Prepared by: Stacie Glaze  Exercises - Hooklying Single Knee to Chest Stretch  - 2 x daily - 7 x weekly - 1 sets - 10 reps - 5 hold - Supine Double Knee to Chest  - 2 x daily - 7 x weekly - 1 sets - 10 reps - 5 hold - Seated Shoulder Shrugs  - 2 x daily - 7 x weekly - 1 sets - 10 reps - 3 hold - Seated Scapular Retraction  - 2 x daily - 7 x weekly - 1 sets - 10 reps - 3 hold - Seated Passive Cervical Retraction  - 1 x daily - 7 x weekly - 1 sets - 10 reps - 3 hold  ASSESSMENT:  CLINICAL IMPRESSION: Patient continues to report doing well, mild pain in the neck, reports biggest worry is the weakness she feels in her legs.  We continued to add exercises and she tolerated without an increase of pain, just needed some cues for posture and core activation.  MD feels that she needs to continue since she is making good progress   OBJECTIVE IMPAIRMENTS: cardiopulmonary status limiting activity, decreased activity tolerance, decreased endurance, decreased mobility, decreased ROM, decreased strength, increased fascial restrictions, increased muscle spasms, impaired flexibility, impaired UE functional use,  improper body mechanics, postural dysfunction, and pain.   REHAB POTENTIAL: Good  CLINICAL DECISION MAKING: Evolving/moderate complexity  EVALUATION COMPLEXITY: Moderate   GOALS: Goals reviewed with patient? Yes  SHORT TERM GOALS: Target date: 06/05/23  Independent with initial HEP Baseline:  Goal status: 05/26/23 MET  LONG TERM  GOALS: Target date: 08/16/23  Understand posture and body mechanics Baseline:  Goal status: 05/28/23 ongoing  2.  Increase cervical ROM 25% Baseline:  Goal status: 06/02/23 MET  3.  Increase lumbar ROM 25% Baseline:  Goal status: 06/02/23 MET  4.  Decrease NDI from 70% to 40% Baseline:  Goal status: INITIAL  5.  Report pain overall decreased 50% Baseline:  Goal status: progressing 05/28/23     PLAN:  PT FREQUENCY: 1-2x/week  PT DURATION: 12 weeks  PLANNED INTERVENTIONS: 97164- PT Re-evaluation, 97110-Therapeutic exercises, 97530- Therapeutic activity, 97112- Neuromuscular re-education, 97535- Self Care, 16109- Manual therapy, G0283- Electrical stimulation (unattended), 97035- Ultrasound, 60454- Traction (mechanical), Patient/Family education, Taping, Dry Needling, Joint mobilization, Cryotherapy, and Moist heat  PLAN FOR NEXT SESSION: continue to progress strength,func and ROM.    Jearld Lesch, PT 06/04/2023, 8:52 AM     [facility] [address] [facility] [address] [place], [place], [address] Phone: [phone]   Fax:  [phone]

## 2023-06-09 ENCOUNTER — Ambulatory Visit: Payer: Medicaid Other | Admitting: Physical Therapy

## 2023-06-09 DIAGNOSIS — M542 Cervicalgia: Secondary | ICD-10-CM | POA: Diagnosis not present

## 2023-06-09 DIAGNOSIS — M5459 Other low back pain: Secondary | ICD-10-CM

## 2023-06-09 DIAGNOSIS — R252 Cramp and spasm: Secondary | ICD-10-CM

## 2023-06-09 NOTE — Therapy (Signed)
 OUTPATIENT PHYSICAL THERAPY CERVICAL    Patient Name: Ashley Pittman MRN: [id number] DOB:[birth year], 43 y.o., female Today's Date: 06/09/2023  END OF SESSION:  PT End of Session - 06/09/23 1145     Visit Number 7    Number of Visits 15    Date for PT Re-Evaluation 08/16/23    Authorization Type [employer]    PT Start Time 1145    PT Stop Time 1225    PT Time Calculation (min) 40 min             No past medical history on file. No past surgical history on file. Patient Active Problem List   Diagnosis Date Noted   DEPRESSION, MAJOR 03/29/2010   LOSS OF APPETITE 03/29/2010   BREAST PAIN, BILATERAL 03/26/2009    PCP: Vincent Peyer, PA  REFERRING PROVIDER: Chadwell, PA  REFERRING DIAG: Cervicalgia  THERAPY DIAG:  Cervicalgia  Cramp and spasm  Other low back pain  Rationale for Evaluation and Treatment: Rehabilitation  ONSET DATE: 04/11/23  SUBJECTIVE:                                                                                                                                                                                                         SUBJECTIVE STATEMENT: Feeling so much better. Getting stronger- still mild weakness RT side- more fatigue   PERTINENT HISTORY:  none  PAIN:  Are you having pain? 2/10 left cervial  PRECAUTIONS: None  RED FLAGS: None     WEIGHT BEARING RESTRICTIONS: No  FALLS:  Has patient fallen in last 6 months? No  LIVING ENVIRONMENT: Lives with: lives with their family Lives in: House/apartment Stairs: Yes: Internal: 15 steps; can reach both Has following equipment at home: None  OCCUPATION: Cashier, lifting objects  PLOF: Independent and did house work, did some yardwork, some walking  PATIENT GOALS: have less pain, move better  NEXT MD VISIT: unsure  OBJECTIVE:  Note: Objective measures were completed at Evaluation unless otherwise noted.  DIAGNOSTIC FINDINGS:    IMPRESSION: 1. No occult fracture or  soft tissue injury.  No cord contusion. 2. 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 An extrusion at C5-6 contacts the ventral cord without compression.    PATIENT SURVEYS:  NDI 35/50 = 70%  COGNITION: Overall cognitive status: Within functional limits for tasks assessed  SENSATION: WFL  POSTURE: rounded shoulders and forward head  PALPATION: Very tight and tender in the neck, upper traps, the rhomboids and the lumbar area   CERVICAL ROM:   Active ROM A/PROM (deg)  eval Act 06/02/23   Flexion Decreased 50% WFLS  Extension Decreased 50% WFLs  Right lateral flexion Decreased 75% WFLs  Left lateral flexion Decreased 75% WFLs  Right rotation Decreased 50% WFLs  Left rotation Decreased 50% WFLs   (Blank rows = not tested)  LUMBAR ROM decreased 75% with pain for all motions  UPPER EXTREMITY ROM:  Active ROM Right eval Left eval RT/Left 06/02/23  Shoulder flexion 130 130 165/160  Shoulder extension     Shoulder abduction     Shoulder adduction     Shoulder extension     Shoulder internal rotation 50 50 WFLs  Shoulder external rotation 60 60 WFLs  Elbow flexion     Elbow extension     Wrist flexion     Wrist extension     Wrist ulnar deviation     Wrist radial deviation     Wrist pronation     Wrist supination      (Blank rows = not tested)  UPPER EXTREMITY MMT:  all increase pain in the neck and shoulders  MMT Right eval Left eval RT/Left 06/02/23  Shoulder flexion 3+ 3+ 4/5  Shoulder extension     Shoulder abduction     Shoulder adduction     Shoulder extension     Shoulder internal rotation 4- 4- 4+/5  Shoulder external rotation 3+ 3+ 4/5  Middle trapezius     Lower trapezius     Elbow flexion     Elbow extension     Wrist flexion     Wrist extension     Wrist ulnar deviation     Wrist radial deviation     Wrist pronation     Wrist supination     Grip strength      (Blank rows = not tested)  CERVICAL SPECIAL TESTS:  All cervical  motions increase pain  FUNCTIONAL TESTS:  Timed up and go (TUG): 31 seconds  TREATMENT DATE:   06/09/23 NustepL 5 7 min Seated row 20# 2 sets 10 Lat pull down 20# 2 sets 10 Chest press 5# 2 sets 10 Leg Press 30# 2 sets 10 HS curls 20# 2x10 Leg extension 10# 2x8 Bicep 15# 2 sets 10 Tricep 25# 2 sets 10 Cable pulley hip 4 way 10 x each BIL    06/04/23 Bike level 3 x 4 minutes Gait around the back building trying to maintain good speed, had a slow pace and a limp on the right especially going down the slope HS curls 15# 2x10 Leg extension 5# 2x10 5# straight arm pulls cues for core and posture Seated row 15# 2x10 Lats 15# 2x10 Slant board calf stretch Leg press 20# 2x10 3# hip extension and abduction  06/02/23 Assessed and documented in charts above ROM and MMT. Lumbar ROM WFLs UBE L 3 2 min fwd/2 min backward Bike L 4 6 min Lat Pull 2 sets 10 15# Seated Row 2 sets 10 15# Black tband back ext 2 sets 10 HS curl 2 sets 10 green tband Knee ext 3# 2 sets 10 Leg Press 20# 2 sets 12 Standing 3# ankle wts alt LE 20 x marching,SL hip flex,ext and abd 2# UE circuit   05/28/23 Nustep L 4  Resisted Gait 20# 3 x 4 way Black Bar heel raise/toe raise 2 sets 10 each Cable pulley row 2 sets 10  10# Cable pulley shld ext 2 sets 10  5# HS curl 10# 2 sets 10, RT LE 5# 10 x-  multiu wt adjustments d/t weakness Knee ext 10# 2 sets 5. 5# RT LE only 2 sets 5- smaller reps d/t fatigue Leg Press 20# 2 sets 10 Head on ball on wall cerv retraction 12 x hold 3 sec 4# shld shruggs and backward rolls 12 x each STS yellow ball chest press 10 x PROM BIL LE and trunl Supine cerv PROM and STW   05/26/23 Nustep L 4 - c/o some RT LE discomfort Yellow tband shld ext ,rows and ER 12 x each- postural cuing needed as sh etends to compensate ,c/o discomfort Head on ball on wall cerv retraction 12 x hold 3 sec 4# shld shruggs and backward rolls 12 x each Feet on ball K2C, rotation, small  bridge, iso abs, KTC Passive LE stretches Red tband clamshells and hip flexion 2 sets 10 Ball b/n knees squeeze 15 x hold 3 sec STM to the neck, upper trap and rhomboids with Pass ROM/stretching            05/21/23  Nustep level 3 x 5 minutes  Yellow tband row  Yellow tband extension  Feet on ball K2C, rotation, small bridge, iso abs Passive LE stretches Red tband clamshells in hooklying Ball b/n knees squeeze Shrugs and shoulder rolls STM to the neck, upper trap and rhomboids                                                                                                                PATIENT EDUCATION:  Education details: HEP/POC Person educated: Patient Education method: Programmer, multimedia, Facilities manager, Verbal cues, and Handouts Education comprehension: verbalized understanding  HOME EXERCISE PROGRAM: Access Code: [email or web address] URL: [email or web address] Date: 05/19/2023 Prepared by: Stacie Glaze  Exercises - Hooklying Single Knee to Chest Stretch  - 2 x daily - 7 x weekly - 1 sets - 10 reps - 5 hold - Supine Double Knee to Chest  - 2 x daily - 7 x weekly - 1 sets - 10 reps - 5 hold - Seated Shoulder Shrugs  - 2 x daily - 7 x weekly - 1 sets - 10 reps - 3 hold - Seated Scapular Retraction  - 2 x daily - 7 x weekly - 1 sets - 10 reps - 3 hold - Seated Passive Cervical Retraction  - 1 x daily - 7 x weekly - 1 sets - 10 reps - 3 hold  ASSESSMENT:  CLINICAL IMPRESSION: Patient continues to report doing well,  reports biggest worry is the weakness she feels in her legs. And some in RT arm.  We continued to add exercises and weight.and she tolerated without an increase of pain, just needed some cues for posture and core activation.  MD feels that she needs to continue since she is making good progress No MD f/u unless needed. Goals assessed and documented.   OBJECTIVE IMPAIRMENTS: cardiopulmonary status limiting activity, decreased activity tolerance, decreased  endurance, decreased mobility, decreased ROM, decreased strength, increased fascial restrictions, increased muscle spasms, impaired flexibility, impaired UE functional use, improper body mechanics, postural dysfunction,  and pain.   REHAB POTENTIAL: Good  CLINICAL DECISION MAKING: Evolving/moderate complexity  EVALUATION COMPLEXITY: Moderate   GOALS: Goals reviewed with patient? Yes  SHORT TERM GOALS: Target date: 06/05/23  Independent with initial HEP Baseline:  Goal status: 05/26/23 MET  LONG TERM GOALS: Target date: 08/16/23  Understand posture and body mechanics Baseline:  Goal status: 05/28/23 ongoing  06/09/23 MET  2.  Increase cervical ROM 25% Baseline:  Goal status: 06/02/23 MET  3.  Increase lumbar ROM 25% Baseline:  Goal status: 06/02/23 MET  4.  Decrease NDI from 70% to 40% Baseline:  Goal status: INITIAL  5.  Report pain overall decreased 50% Baseline:  Goal status: progressing 05/28/23     06/09/23 MET  PLAN:  PT FREQUENCY: 1-2x/week  PT DURATION: 12 weeks  PLANNED INTERVENTIONS: 97164- PT Re-evaluation, 97110-Therapeutic exercises, 97530- Therapeutic activity, 97112- Neuromuscular re-education, 97535- Self Care, 47829- Manual therapy, G0283- Electrical stimulation (unattended), 97035- Ultrasound, 56213- Traction (mechanical), Patient/Family education, Taping, Dry Needling, Joint mobilization, Cryotherapy, and Moist heat  PLAN FOR NEXT SESSION: assess how she tolerated increased wt   Emilo Gras,ANGIE, PTA 06/09/2023, 11:45 AM     [facility] [address] [facility] [place], [place], [address] Phone: [phone]   Fax:  [phone] at [facility] [address] [place], [place], [address] Phone: [phone]   Fax:  [phone]  Patient Details  Name: Ashley Pittman MRN: [id number] Date of Birth: April 16, 1980 Referring Provider:  Margart Sickles,  PA-C  Encounter Date: 06/09/2023   Suanne Marker, PTA 06/09/2023, 11:45 AM  [place] [facility] [address] [facility] [place], [place], [address] Phone: [phone]   Fax:  [phone]

## 2023-06-11 ENCOUNTER — Ambulatory Visit: Payer: Medicaid Other | Admitting: Physical Therapy

## 2023-06-11 DIAGNOSIS — M5459 Other low back pain: Secondary | ICD-10-CM

## 2023-06-11 DIAGNOSIS — M542 Cervicalgia: Secondary | ICD-10-CM

## 2023-06-11 NOTE — Therapy (Signed)
 OUTPATIENT PHYSICAL THERAPY CERVICAL    Patient Name: Ashley Pittman MRN: [id number] DOB:06-28-1980, 43 y.o., female Today's Date: 06/11/2023  END OF SESSION:  PT End of Session - 06/11/23 1139     Visit Number 8    Number of Visits 15    Date for PT Re-Evaluation 08/16/23    Authorization Type [employer]    PT Start Time 1145    PT Stop Time 1225    PT Time Calculation (min) 40 min             No past medical history on file. No past surgical history on file. Patient Active Problem List   Diagnosis Date Noted   DEPRESSION, MAJOR 03/29/2010   LOSS OF APPETITE 03/29/2010   BREAST PAIN, BILATERAL 03/26/2009    PCP: Vincent Peyer, PA  REFERRING PROVIDER: Chadwell, PA  REFERRING DIAG: Cervicalgia  THERAPY DIAG:  Cervicalgia  Other low back pain  Rationale for Evaluation and Treatment: Rehabilitation  ONSET DATE: 04/11/23  SUBJECTIVE:                                                                                                                                                                                                         SUBJECTIVE STATEMENT: Alittle sore after last session but okay. Worked yesterday and did some cleaning and lifting RT shld very sore   PERTINENT HISTORY:  none  PAIN:  Are you having pain? 2/10 left cervial  PRECAUTIONS: None  RED FLAGS: None     WEIGHT BEARING RESTRICTIONS: No  FALLS:  Has patient fallen in last 6 months? No  LIVING ENVIRONMENT: Lives with: lives with their family Lives in: House/apartment Stairs: Yes: Internal: 15 steps; can reach both Has following equipment at home: None  OCCUPATION: Cashier, lifting objects  PLOF: Independent and did house work, did some yardwork, some walking  PATIENT GOALS: have less pain, move better  NEXT MD VISIT: unsure  OBJECTIVE:  Note: Objective measures were completed at Evaluation unless otherwise noted.  DIAGNOSTIC FINDINGS:    IMPRESSION: 1. No occult  fracture or soft tissue injury.  No cord contusion. 2. 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 An extrusion at C5-6 contacts the ventral cord without compression.    PATIENT SURVEYS:  NDI 35/50 = 70%  COGNITION: Overall cognitive status: Within functional limits for tasks assessed  SENSATION: WFL  POSTURE: rounded shoulders and forward head  PALPATION: Very tight and tender in the neck, upper traps, the rhomboids and the lumbar area   CERVICAL ROM:   Active ROM  A/PROM (deg) eval Act 06/02/23   Flexion Decreased 50% WFLS  Extension Decreased 50% WFLs  Right lateral flexion Decreased 75% WFLs  Left lateral flexion Decreased 75% WFLs  Right rotation Decreased 50% WFLs  Left rotation Decreased 50% WFLs   (Blank rows = not tested)  LUMBAR ROM decreased 75% with pain for all motions  UPPER EXTREMITY ROM:  Active ROM Right eval Left eval RT/Left 06/02/23  Shoulder flexion 130 130 165/160  Shoulder extension     Shoulder abduction     Shoulder adduction     Shoulder extension     Shoulder internal rotation 50 50 WFLs  Shoulder external rotation 60 60 WFLs  Elbow flexion     Elbow extension     Wrist flexion     Wrist extension     Wrist ulnar deviation     Wrist radial deviation     Wrist pronation     Wrist supination      (Blank rows = not tested)  UPPER EXTREMITY MMT:  all increase pain in the neck and shoulders  MMT Right eval Left eval RT/Left 06/02/23  Shoulder flexion 3+ 3+ 4/5  Shoulder extension     Shoulder abduction     Shoulder adduction     Shoulder extension     Shoulder internal rotation 4- 4- 4+/5  Shoulder external rotation 3+ 3+ 4/5  Middle trapezius     Lower trapezius     Elbow flexion     Elbow extension     Wrist flexion     Wrist extension     Wrist ulnar deviation     Wrist radial deviation     Wrist pronation     Wrist supination     Grip strength      (Blank rows = not tested)  CERVICAL SPECIAL TESTS:  All  cervical motions increase pain  FUNCTIONAL TESTS:  Timed up and go (TUG): 31 seconds  TREATMENT DATE:   06/11/23  Nustep L 5 Red tband shld stab on wall 3 way 12 x each 10# box lifting- waist high to eyes, floor to waist, rotation 5 x each with BM cuing esp to bend knees ( some increased LB and RT shld discomfort) Educ on BM for lifting and ADLs and she demo and VU PROM and joint mobs to RT shld to help with pain 5# shruggs,backward rolls and shld ext 10 x each HS curls 20# 2x10 Leg extension 10# 2x10 Red tband shld ext,row and ER 2 sets 10   06/09/23 NustepL 5 7 min Seated row 20# 2 sets 10 Lat pull down 20# 2 sets 10 Chest press 5# 2 sets 10 Leg Press 30# 2 sets 10 HS curls 20# 2x10 Leg extension 10# 2x8 Bicep 15# 2 sets 10 Tricep 25# 2 sets 10 Cable pulley hip 4 way 10 x each BIL    06/04/23 Bike level 3 x 4 minutes Gait around the back building trying to maintain good speed, had a slow pace and a limp on the right especially going down the slope HS curls 15# 2x10 Leg extension 5# 2x10 5# straight arm pulls cues for core and posture Seated row 15# 2x10 Lats 15# 2x10 Slant board calf stretch Leg press 20# 2x10 3# hip extension and abduction  06/02/23 Assessed and documented in charts above ROM and MMT. Lumbar ROM WFLs UBE L 3 2 min fwd/2 min backward Bike L 4 6 min Lat Pull 2 sets 10 15# Seated  Row 2 sets 10 15# Black tband back ext 2 sets 10 HS curl 2 sets 10 green tband Knee ext 3# 2 sets 10 Leg Press 20# 2 sets 12 Standing 3# ankle wts alt LE 20 x marching,SL hip flex,ext and abd 2# UE circuit   05/28/23 Nustep L 4  Resisted Gait 20# 3 x 4 way Black Bar heel raise/toe raise 2 sets 10 each Cable pulley row 2 sets 10  10# Cable pulley shld ext 2 sets 10  5# HS curl 10# 2 sets 10, RT LE 5# 10 x- multiu wt adjustments d/t weakness Knee ext 10# 2 sets 5. 5# RT LE only 2 sets 5- smaller reps d/t fatigue Leg Press 20# 2 sets 10 Head on ball on  wall cerv retraction 12 x hold 3 sec 4# shld shruggs and backward rolls 12 x each STS yellow ball chest press 10 x PROM BIL LE and trunl Supine cerv PROM and STW   05/26/23 Nustep L 4 - c/o some RT LE discomfort Yellow tband shld ext ,rows and ER 12 x each- postural cuing needed as sh etends to compensate ,c/o discomfort Head on ball on wall cerv retraction 12 x hold 3 sec 4# shld shruggs and backward rolls 12 x each Feet on ball K2C, rotation, small bridge, iso abs, KTC Passive LE stretches Red tband clamshells and hip flexion 2 sets 10 Ball b/n knees squeeze 15 x hold 3 sec STM to the neck, upper trap and rhomboids with Pass ROM/stretching            05/21/23  Nustep level 3 x 5 minutes  Yellow tband row  Yellow tband extension  Feet on ball K2C, rotation, small bridge, iso abs Passive LE stretches Red tband clamshells in hooklying Ball b/n knees squeeze Shrugs and shoulder rolls STM to the neck, upper trap and rhomboids                                                                                                                PATIENT EDUCATION:  Education details: HEP/POC Person educated: Patient Education method: Programmer, multimedia, Facilities manager, Verbal cues, and Handouts Education comprehension: verbalized understanding  HOME EXERCISE PROGRAM: Access Code: [email or web address] URL: [email or web address] Date: 05/19/2023 Prepared by: Stacie Glaze  Exercises - Hooklying Single Knee to Chest Stretch  - 2 x daily - 7 x weekly - 1 sets - 10 reps - 5 hold - Supine Double Knee to Chest  - 2 x daily - 7 x weekly - 1 sets - 10 reps - 5 hold - Seated Shoulder Shrugs  - 2 x daily - 7 x weekly - 1 sets - 10 reps - 3 hold - Seated Scapular Retraction  - 2 x daily - 7 x weekly - 1 sets - 10 reps - 3 hold - Seated Passive Cervical Retraction  - 1 x daily - 7 x weekly - 1 sets - 10 reps - 3 hold  ASSESSMENT:  CLINICAL IMPRESSION: pt arrived with RT shld pain after  working some yesterday. She did struggle through session with shld pain with certain mvmt and then some mvmt no pain. Educ on BM and lifting for RTW.  OBJECTIVE IMPAIRMENTS: cardiopulmonary status limiting activity, decreased activity tolerance, decreased endurance, decreased mobility, decreased ROM, decreased strength, increased fascial restrictions, increased muscle spasms, impaired flexibility, impaired UE functional use, improper body mechanics, postural dysfunction, and pain.   REHAB POTENTIAL: Good  CLINICAL DECISION MAKING: Evolving/moderate complexity  EVALUATION COMPLEXITY: Moderate   GOALS: Goals reviewed with patient? Yes  SHORT TERM GOALS: Target date: 06/05/23  Independent with initial HEP Baseline:  Goal status: 05/26/23 MET  LONG TERM GOALS: Target date: 08/16/23  Understand posture and body mechanics Baseline:  Goal status: 05/28/23 ongoing  06/09/23 MET  2.  Increase cervical ROM 25% Baseline:  Goal status: 06/02/23 MET  3.  Increase lumbar ROM 25% Baseline:  Goal status: 06/02/23 MET  4.  Decrease NDI from 70% to 40% Baseline:  Goal status: INITIAL  5.  Report pain overall decreased 50% Baseline:  Goal status: progressing 05/28/23     06/09/23 MET  PLAN:  PT FREQUENCY: 1-2x/week  PT DURATION: 12 weeks  PLANNED INTERVENTIONS: 97164- PT Re-evaluation, 97110-Therapeutic exercises, 97530- Therapeutic activity, 97112- Neuromuscular re-education, 97535- Self Care, 16606- Manual therapy, G0283- Electrical stimulation (unattended), 97035- Ultrasound, 30160- Traction (mechanical), Patient/Family education, Taping, Dry Needling, Joint mobilization, Cryotherapy, and Moist heat  PLAN FOR NEXT SESSION: 2 visits next week a look to D/C if doing well   Caera Enwright,ANGIE, PTA 06/11/2023, 11:40 AM     [facility] Out[facility] Rehabilitation at [facility] [address] [facility]. [place], [place], [address] Phone: [phone]   Fax:  [phone] at [facility] [address] [facility] [address] [facility] [address]. [place], [place], [address] Phone: [phone]   Fax:  [phone]  Patient Details  Name: ERNESTA TRABERT MRN: [id number] Date of Birth: 11-30-1980 Referring Provider:  Margart Sickles, PA-C  Encounter Date: 06/11/2023   Suanne Marker, PTA 06/11/2023, 11:40 AM  [facility] at [facility] [address] [facility]. [place], [place], [address] Phone: [phone]   Fax:  [phone] Rehabilitation at [facility] [address] [facility] [address] [place], [place], [address] Phone: [phone]   Fax:  [phone]  Patient Details  Name: HARLI ENGELKEN MRN: [id number] Date of Birth: 28-May-1980 Referring Provider:  Margart Sickles, PA-C  Encounter Date: 06/11/2023   Suanne Marker, PTA 06/11/2023, 11:40 AM  [place]  Outpatient Rehabilitation at [facility] [address] [facility] [place], [place], [address] Phone: [phone]   Fax:  [phone]

## 2023-06-16 ENCOUNTER — Ambulatory Visit: Payer: Medicaid Other | Admitting: Physical Therapy

## 2023-06-18 ENCOUNTER — Ambulatory Visit: Payer: Medicaid Other | Admitting: Physical Therapy

## 2023-06-29 ENCOUNTER — Ambulatory Visit: Attending: Physician Assistant | Admitting: Physical Therapy

## 2023-06-29 DIAGNOSIS — M542 Cervicalgia: Secondary | ICD-10-CM | POA: Diagnosis present

## 2023-06-29 DIAGNOSIS — M5459 Other low back pain: Secondary | ICD-10-CM | POA: Insufficient documentation

## 2023-06-29 NOTE — Therapy (Signed)
 OUTPATIENT PHYSICAL THERAPY CERVICAL    Patient Name: Ashley Pittman MRN: [id number] DOB:Jan 20, 1981, 43 y.o., female Today's Date: 06/29/2023  END OF SESSION:  PT End of Session - 06/29/23 1359     Visit Number 9    Number of Visits 15    Date for PT Re-Evaluation 08/16/23    Authorization Type [employer]    PT Start Time 1400    PT Stop Time 1440    PT Time Calculation (min) 40 min             No past medical history on file. No past surgical history on file. Patient Active Problem List   Diagnosis Date Noted   DEPRESSION, MAJOR 03/29/2010   LOSS OF APPETITE 03/29/2010   BREAST PAIN, BILATERAL 03/26/2009    PCP: Vincent Peyer, PA  REFERRING PROVIDER: Chadwell, PA  REFERRING DIAG: Cervicalgia  THERAPY DIAG:  Cervicalgia  Rationale for Evaluation and Treatment: Rehabilitation  ONSET DATE: 04/11/23  SUBJECTIVE:                                                                                                                                                                                                         SUBJECTIVE STATEMENT: Back pain comes and goes,can't stand so long RT leg pain and tingles ,driving is hard. No leg energy Everything else is good. Flu last couple weeks   PERTINENT HISTORY:  none  PAIN:  Are you having pain? 2/10 left cervial  PRECAUTIONS: None  RED FLAGS: None     WEIGHT BEARING RESTRICTIONS: No  FALLS:  Has patient fallen in last 6 months? No  LIVING ENVIRONMENT: Lives with: lives with their family Lives in: House/apartment Stairs: Yes: Internal: 15 steps; can reach both Has following equipment at home: None  OCCUPATION: Cashier, lifting objects  PLOF: Independent and did house work, did some yardwork, some walking  PATIENT GOALS: have less pain, move better  NEXT MD VISIT: unsure  OBJECTIVE:  Note: Objective measures were completed at Evaluation unless otherwise noted.  DIAGNOSTIC FINDINGS:    IMPRESSION: 1.  No occult fracture or soft tissue injury.  No cord contusion. 2. 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 An extrusion at C5-6 contacts the ventral cord without compression.    PATIENT SURVEYS:  NDI 35/50 = 70%  COGNITION: Overall cognitive status: Within functional limits for tasks assessed  SENSATION: WFL  POSTURE: rounded shoulders and forward head  PALPATION: Very tight and tender in the neck, upper traps, the rhomboids and the lumbar area   CERVICAL ROM:  Active ROM A/PROM (deg) eval Act 06/02/23   Flexion Decreased 50% WFLS  Extension Decreased 50% WFLs  Right lateral flexion Decreased 75% WFLs  Left lateral flexion Decreased 75% WFLs  Right rotation Decreased 50% WFLs  Left rotation Decreased 50% WFLs   (Blank rows = not tested)  LUMBAR ROM decreased 75% with pain for all motions  UPPER EXTREMITY ROM:  Active ROM Right eval Left eval RT/Left 06/02/23  Shoulder flexion 130 130 165/160  Shoulder extension     Shoulder abduction     Shoulder adduction     Shoulder extension     Shoulder internal rotation 50 50 WFLs  Shoulder external rotation 60 60 WFLs  Elbow flexion     Elbow extension     Wrist flexion     Wrist extension     Wrist ulnar deviation     Wrist radial deviation     Wrist pronation     Wrist supination      (Blank rows = not tested)  UPPER EXTREMITY MMT:  all increase pain in the neck and shoulders  MMT Right eval Left eval RT/Left 06/02/23  Shoulder flexion 3+ 3+ 4/5  Shoulder extension     Shoulder abduction     Shoulder adduction     Shoulder extension     Shoulder internal rotation 4- 4- 4+/5  Shoulder external rotation 3+ 3+ 4/5  Middle trapezius     Lower trapezius     Elbow flexion     Elbow extension     Wrist flexion     Wrist extension     Wrist ulnar deviation     Wrist radial deviation     Wrist pronation     Wrist supination     Grip strength      (Blank rows = not tested)  CERVICAL SPECIAL  TESTS:  All cervical motions increase pain  FUNCTIONAL TESTS:  Timed up and go (TUG): 31 seconds  TREATMENT DATE:   06/29/23 Manual sheet traction- increased symptoms Repeated ext in prone NE, with overpressure symptoms decreased REIS standing with overpressure relieved symptoms- issued for HEP and instructed to do as needed for symptoms Bike L 4 6 min - tingling after, REIS with towel decreased symptoms HS curl 20# 2 sets 10 Knee ext 10 # 2 sets 10- no tingling, just fatigue Explained centralization    06/11/23  Nustep L 5 Red tband shld stab on wall 3 way 12 x each 10# box lifting- waist high to eyes, floor to waist, rotation 5 x each with BM cuing esp to bend knees ( some increased LB and RT shld discomfort) Educ on BM for lifting and ADLs and she demo and VU PROM and joint mobs to RT shld to help with pain 5# shruggs,backward rolls and shld ext 10 x each HS curls 20# 2x10 Leg extension 10# 2x10 Red tband shld ext,row and ER 2 sets 10   06/09/23 NustepL 5 7 min Seated row 20# 2 sets 10 Lat pull down 20# 2 sets 10 Chest press 5# 2 sets 10 Leg Press 30# 2 sets 10 HS curls 20# 2x10 Leg extension 10# 2x8 Bicep 15# 2 sets 10 Tricep 25# 2 sets 10 Cable pulley hip 4 way 10 x each BIL    06/04/23 Bike level 3 x 4 minutes Gait around the back building trying to maintain good speed, had a slow pace and a limp on the right especially going down the slope HS curls  15# 2x10 Leg extension 5# 2x10 5# straight arm pulls cues for core and posture Seated row 15# 2x10 Lats 15# 2x10 Slant board calf stretch Leg press 20# 2x10 3# hip extension and abduction  06/02/23 Assessed and documented in charts above ROM and MMT. Lumbar ROM WFLs UBE L 3 2 min fwd/2 min backward Bike L 4 6 min Lat Pull 2 sets 10 15# Seated Row 2 sets 10 15# Black tband back ext 2 sets 10 HS curl 2 sets 10 green tband Knee ext 3# 2 sets 10 Leg Press 20# 2 sets 12 Standing 3# ankle wts alt LE 20 x  marching,SL hip flex,ext and abd 2# UE circuit   05/28/23 Nustep L 4  Resisted Gait 20# 3 x 4 way Black Bar heel raise/toe raise 2 sets 10 each Cable pulley row 2 sets 10  10# Cable pulley shld ext 2 sets 10  5# HS curl 10# 2 sets 10, RT LE 5# 10 x- multiu wt adjustments d/t weakness Knee ext 10# 2 sets 5. 5# RT LE only 2 sets 5- smaller reps d/t fatigue Leg Press 20# 2 sets 10 Head on ball on wall cerv retraction 12 x hold 3 sec 4# shld shruggs and backward rolls 12 x each STS yellow ball chest press 10 x PROM BIL LE and trunl Supine cerv PROM and STW   05/26/23 Nustep L 4 - c/o some RT LE discomfort Yellow tband shld ext ,rows and ER 12 x each- postural cuing needed as sh etends to compensate ,c/o discomfort Head on ball on wall cerv retraction 12 x hold 3 sec 4# shld shruggs and backward rolls 12 x each Feet on ball K2C, rotation, small bridge, iso abs, KTC Passive LE stretches Red tband clamshells and hip flexion 2 sets 10 Ball b/n knees squeeze 15 x hold 3 sec STM to the neck, upper trap and rhomboids with Pass ROM/stretching            05/21/23  Nustep level 3 x 5 minutes  Yellow tband row  Yellow tband extension  Feet on ball K2C, rotation, small bridge, iso abs Passive LE stretches Red tband clamshells in hooklying Ball b/n knees squeeze Shrugs and shoulder rolls STM to the neck, upper trap and rhomboids                                                                                                                PATIENT EDUCATION:  Education details: HEP/POC Person educated: Patient Education method: Programmer, multimedia, Facilities manager, Verbal cues, and Handouts Education comprehension: verbalized understanding  HOME EXERCISE PROGRAM: Access Code: [email or web address] URL: [email or web address] Date: 05/19/2023 Prepared by: Stacie Glaze  Exercises - Hooklying Single Knee to Chest Stretch  - 2 x daily - 7 x weekly - 1 sets - 10 reps - 5 hold -  Supine Double Knee to Chest  - 2 x daily - 7 x weekly - 1 sets - 10 reps - 5 hold - Seated Shoulder Shrugs  - 2 x daily -  7 x weekly - 1 sets - 10 reps - 3 hold - Seated Scapular Retraction  - 2 x daily - 7 x weekly - 1 sets - 10 reps - 3 hold - Seated Passive Cervical Retraction  - 1 x daily - 7 x weekly - 1 sets - 10 reps - 3 hold  ASSESSMENT:  CLINICAL IMPRESSION: pt arrived after FLU for 2 weeks. All symptoms gone except LBP intermittent and RT LE tingling and heaviness. Responded well to REIS with overpressure so issued as HEP. ALL goals met except DGI but leg symptoms have gotten worse since last visit. OBJECTIVE IMPAIRMENTS: cardiopulmonary status limiting activity, decreased activity tolerance, decreased endurance, decreased mobility, decreased ROM, decreased strength, increased fascial restrictions, increased muscle spasms, impaired flexibility, impaired UE functional use, improper body mechanics, postural dysfunction, and pain.   REHAB POTENTIAL: Good  CLINICAL DECISION MAKING: Evolving/moderate complexity  EVALUATION COMPLEXITY: Moderate   GOALS: Goals reviewed with patient? Yes  SHORT TERM GOALS: Target date: 06/05/23  Independent with initial HEP Baseline:  Goal status: 05/26/23 MET  LONG TERM GOALS: Target date: 08/16/23  Understand posture and body mechanics Baseline:  Goal status: 05/28/23 ongoing  06/09/23 MET  2.  Increase cervical ROM 25% Baseline:  Goal status: 06/02/23 MET  3.  Increase lumbar ROM 25% Baseline:  Goal status: 06/02/23 MET  4.  Decrease NDI from 70% to 40% Baseline:  Goal status: INITIAL  5.  Report pain overall decreased 50% Baseline:  Goal status: progressing 05/28/23     06/09/23 MET  PLAN:  PT FREQUENCY: 1-2x/week  PT DURATION: 12 weeks  PLANNED INTERVENTIONS: 97164- PT Re-evaluation, 97110-Therapeutic exercises, 97530- Therapeutic activity, 97112- Neuromuscular re-education, 97535- Self Care, 40981- Manual therapy, G0283- Electrical  stimulation (unattended), 97035- Ultrasound, 19147- Traction (mechanical), Patient/Family education, Taping, Dry Needling, Joint mobilization, Cryotherapy, and Moist heat  PLAN FOR NEXT SESSION: assess leg pain and response to REIS   Yordin Rhoda,ANGIE, PTA 06/29/2023, 2:00 PM     [facility] Outpatient Rehabilitation at [facility] [address] [facility]. [place], [place], [address] Phone: [phone]   Fax:  [phone] Rehabilitation at [facility] [address] [place], [place], [address] Phone: [phone]   Fax:  [phone]  Patient Details  Name: ORLENE SALMONS MRN: [id number] Date of Birth: 03-13-1981 Referring Provider:  Margart Sickles, PA-C  Encounter Date: 06/29/2023   Suanne Marker, PTA 06/29/2023, 2:00 PM  [facility] Outpatient Rehabilitation at [facility] [address] [facility] [place], [place], [address] Phone: [phone]   Fax:  [phone] Rehabilitation at [facility] [address] [place], [place], [address] Phone: [phone]   Fax:  [phone]

## 2023-07-16 ENCOUNTER — Encounter: Payer: Self-pay | Admitting: Physical Therapy

## 2023-07-16 ENCOUNTER — Ambulatory Visit: Admitting: Physical Therapy

## 2023-07-16 DIAGNOSIS — M542 Cervicalgia: Secondary | ICD-10-CM

## 2023-07-16 DIAGNOSIS — M5459 Other low back pain: Secondary | ICD-10-CM

## 2023-07-16 NOTE — Therapy (Signed)
 OUTPATIENT PHYSICAL THERAPY CERVICAL    Patient Name: Ashley Pittman MRN: [id number] DOB:04/23/1980, 43 y.o., female Today's Date: 07/16/2023  END OF SESSION:  PT End of Session - 07/16/23 0927     Visit Number 10    Number of Visits 15    Date for PT Re-Evaluation 08/16/23    Authorization Type [employer]    PT Start Time 0930    PT Stop Time 1015    PT Time Calculation (min) 45 min             No past medical history on file. No past surgical history on file. Patient Active Problem List   Diagnosis Date Noted   DEPRESSION, MAJOR 03/29/2010   LOSS OF APPETITE 03/29/2010   BREAST PAIN, BILATERAL 03/26/2009    PCP: Ailene Housekeeper, PA  REFERRING PROVIDER: Chadwell, PA  REFERRING DIAG: Cervicalgia  THERAPY DIAG:  Cervicalgia  Other low back pain  Rationale for Evaluation and Treatment: Rehabilitation  ONSET DATE: 04/11/23  SUBJECTIVE:                                                                                                                                                                                                         SUBJECTIVE STATEMENT: She isn't having any pain in her back or neck. Sometimes she will get some irritation in her low back, but doesn't know what activities cause it. He R leg tingles when she presses the gas in her car, and sometimes with tasks at work. She is doing less activities at work and thinks that that is helping.    PERTINENT HISTORY:  none  PAIN:  Are you having pain? 0/10   PRECAUTIONS: None  RED FLAGS: None     WEIGHT BEARING RESTRICTIONS: No  FALLS:  Has patient fallen in last 6 months? No  LIVING ENVIRONMENT: Lives with: lives with their family Lives in: House/apartment Stairs: Yes: Internal: 15 steps; can reach both Has following equipment at home: None  OCCUPATION: Cashier, lifting objects  PLOF: Independent and did house work, did some yardwork, some walking  PATIENT GOALS: have less pain, move  better  NEXT MD VISIT: unsure  OBJECTIVE:  Note: Objective measures were completed at Evaluation unless otherwise noted.  DIAGNOSTIC FINDINGS:    IMPRESSION: 1. No occult fracture or soft tissue injury.  No cord contusion. 2. 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 An extrusion at C5-6 contacts the ventral cord without compression.    PATIENT SURVEYS:  NDI 35/50 = 70%  COGNITION: Overall cognitive status: Within functional  limits for tasks assessed  SENSATION: WFL  POSTURE: rounded shoulders and forward head  PALPATION: Very tight and tender in the neck, upper traps, the rhomboids and the lumbar area   CERVICAL ROM:   Active ROM A/PROM (deg) eval Act 06/02/23   Flexion Decreased 50% WFLS  Extension Decreased 50% WFLs  Right lateral flexion Decreased 75% WFLs  Left lateral flexion Decreased 75% WFLs  Right rotation Decreased 50% WFLs  Left rotation Decreased 50% WFLs   (Blank rows = not tested)  LUMBAR ROM decreased 75% with pain for all motions  UPPER EXTREMITY ROM:  Active ROM Right eval Left eval RT/Left 06/02/23  Shoulder flexion 130 130 165/160  Shoulder extension     Shoulder abduction     Shoulder adduction     Shoulder extension     Shoulder internal rotation 50 50 WFLs  Shoulder external rotation 60 60 WFLs  Elbow flexion     Elbow extension     Wrist flexion     Wrist extension     Wrist ulnar deviation     Wrist radial deviation     Wrist pronation     Wrist supination      (Blank rows = not tested)  UPPER EXTREMITY MMT:  all increase pain in the neck and shoulders  MMT Right eval Left eval RT/Left 06/02/23  Shoulder flexion 3+ 3+ 4/5  Shoulder extension     Shoulder abduction     Shoulder adduction     Shoulder extension     Shoulder internal rotation 4- 4- 4+/5  Shoulder external rotation 3+ 3+ 4/5  Middle trapezius     Lower trapezius     Elbow flexion     Elbow extension     Wrist flexion     Wrist extension      Wrist ulnar deviation     Wrist radial deviation     Wrist pronation     Wrist supination     Grip strength      (Blank rows = not tested)  CERVICAL SPECIAL TESTS:  All cervical motions increase pain  FUNCTIONAL TESTS:  Timed up and go (TUG): 31 seconds  TREATMENT DATE: 07/16/23 Nustep L5 Slant board stretch 8# march in place 2x20 S2S yellow ball OH press 2x10 Suitcase carry 8# x2 laps each Red band resisted hip ext, abd, flex x10 each  Black bar heel raise 2x10 HS stretch, ER, hip flex     06/29/23 Manual sheet traction- increased symptoms Repeated ext in prone NE, with overpressure symptoms decreased REIS standing with overpressure relieved symptoms- issued for HEP and instructed to do as needed for symptoms Bike L 4 6 min - tingling after, REIS with towel decreased symptoms HS curl 20# 2 sets 10 Knee ext 10 # 2 sets 10- no tingling, just fatigue Explained centralization    06/11/23  Nustep L 5 Red tband shld stab on wall 3 way 12 x each 10# box lifting- waist high to eyes, floor to waist, rotation 5 x each with BM cuing esp to bend knees ( some increased LB and RT shld discomfort) Educ on BM for lifting and ADLs and she demo and VU PROM and joint mobs to RT shld to help with pain 5# shruggs,backward rolls and shld ext 10 x each HS curls 20# 2x10 Leg extension 10# 2x10 Red tband shld ext,row and ER 2 sets 10   06/09/23 NustepL 5 7 min Seated row 20# 2 sets 10  Lat pull down 20# 2 sets 10 Chest press 5# 2 sets 10 Leg Press 30# 2 sets 10 HS curls 20# 2x10 Leg extension 10# 2x8 Bicep 15# 2 sets 10 Tricep 25# 2 sets 10 Cable pulley hip 4 way 10 x each BIL    06/04/23 Bike level 3 x 4 minutes Gait around the back building trying to maintain good speed, had a slow pace and a limp on the right especially going down the slope HS curls 15# 2x10 Leg extension 5# 2x10 5# straight arm pulls cues for core and posture Seated row 15# 2x10 Lats 15#  2x10 Slant board calf stretch Leg press 20# 2x10 3# hip extension and abduction  06/02/23 Assessed and documented in charts above ROM and MMT. Lumbar ROM WFLs UBE L 3 2 min fwd/2 min backward Bike L 4 6 min Lat Pull 2 sets 10 15# Seated Row 2 sets 10 15# Black tband back ext 2 sets 10 HS curl 2 sets 10 green tband Knee ext 3# 2 sets 10 Leg Press 20# 2 sets 12 Standing 3# ankle wts alt LE 20 x marching,SL hip flex,ext and abd 2# UE circuit   05/28/23 Nustep L 4  Resisted Gait 20# 3 x 4 way Black Bar heel raise/toe raise 2 sets 10 each Cable pulley row 2 sets 10  10# Cable pulley shld ext 2 sets 10  5# HS curl 10# 2 sets 10, RT LE 5# 10 x- multiu wt adjustments d/t weakness Knee ext 10# 2 sets 5. 5# RT LE only 2 sets 5- smaller reps d/t fatigue Leg Press 20# 2 sets 10 Head on ball on wall cerv retraction 12 x hold 3 sec 4# shld shruggs and backward rolls 12 x each STS yellow ball chest press 10 x PROM BIL LE and trunl Supine cerv PROM and STW   05/26/23 Nustep L 4 - c/o some RT LE discomfort Yellow tband shld ext ,rows and ER 12 x each- postural cuing needed as sh etends to compensate ,c/o discomfort Head on ball on wall cerv retraction 12 x hold 3 sec 4# shld shruggs and backward rolls 12 x each Feet on ball K2C, rotation, small bridge, iso abs, KTC Passive LE stretches Red tband clamshells and hip flexion 2 sets 10 Ball b/n knees squeeze 15 x hold 3 sec STM to the neck, upper trap and rhomboids with Pass ROM/stretching            05/21/23  Nustep level 3 x 5 minutes  Yellow tband row  Yellow tband extension  Feet on ball K2C, rotation, small bridge, iso abs Passive LE stretches Red tband clamshells in hooklying Ball b/n knees squeeze Shrugs and shoulder rolls STM to the neck, upper trap and rhomboids                                                                                                                PATIENT EDUCATION:  Education details:  HEP/POC Person educated: Patient Education method: Programmer, multimedia, Facilities manager, Verbal  cues, and Handouts Education comprehension: verbalized understanding  HOME EXERCISE PROGRAM: Access Code: [email or web address] URL: [email or web address] Date: 05/19/2023 Prepared by: Cherylene Corrente  Exercises - Hooklying Single Knee to Chest Stretch  - 2 x daily - 7 x weekly - 1 sets - 10 reps - 5 hold - Supine Double Knee to Chest  - 2 x daily - 7 x weekly - 1 sets - 10 reps - 5 hold - Seated Shoulder Shrugs  - 2 x daily - 7 x weekly - 1 sets - 10 reps - 3 hold - Seated Scapular Retraction  - 2 x daily - 7 x weekly - 1 sets - 10 reps - 3 hold - Seated Passive Cervical Retraction  - 1 x daily - 7 x weekly - 1 sets - 10 reps - 3 hold  ASSESSMENT:  CLINICAL IMPRESSION: pt arrived doing well with no pain. On, the nustep, she reported some tingling in her r calf when she would push wit her RLE. She tolerated all other activities well w/o complains of pain or tingling I her RLE. During the heel raise, she reported weakness in R calf, but was able to complete her sets. During stretching, she had slightly more tightness in her RLE compared to her LLE. Overall she is doing well, all goals were met, and will be discharged today.   OBJECTIVE IMPAIRMENTS: cardiopulmonary status limiting activity, decreased activity tolerance, decreased endurance, decreased mobility, decreased ROM, decreased strength, increased fascial restrictions, increased muscle spasms, impaired flexibility, impaired UE functional use, improper body mechanics, postural dysfunction, and pain.   REHAB POTENTIAL: Good  CLINICAL DECISION MAKING: Evolving/moderate complexity  EVALUATION COMPLEXITY: Moderate   GOALS: Goals reviewed with patient? Yes  SHORT TERM GOALS: Target date: 06/05/23  Independent with initial HEP Baseline:  Goal status: 05/26/23 MET  LONG TERM GOALS: Target date: 08/16/23  Understand posture and body  mechanics Baseline:  Goal status: 05/28/23 ongoing  06/09/23 MET  2.  Increase cervical ROM 25% Baseline:  Goal status: 06/02/23 MET  3.  Increase lumbar ROM 25% Baseline:  Goal status: 06/02/23 MET  4.  Decrease NDI from 70% to 40% Baseline:  Goal status: MET 07/16/23  5.  Report pain overall decreased 50% Baseline:  Goal status: progressing 05/28/23     06/09/23 MET  PLAN:  PT FREQUENCY: 1-2x/week  PT DURATION: 12 weeks  PLANNED INTERVENTIONS: 97164- PT Re-evaluation, 97110-Therapeutic exercises, 97530- Therapeutic activity, 97112- Neuromuscular re-education, 97535- Self Care, 52841- Manual therapy, G0283- Electrical stimulation (unattended), 97035- Ultrasound, 32440- Traction (mechanical), Patient/Family education, Taping, Dry Needling, Joint mobilization, Cryotherapy, and Moist heat  PLAN FOR NEXT SESSION: Discharge today  PHYSICAL THERAPY DISCHARGE SUMMARY   Patient agrees to discharge. Patient goals were met. Patient is being discharged due to meeting the stated rehab goals.    Laurelyn Ponder 07/16/2023, 9:28 AM     [facility] [address] [place], [place], [address] Phone: [phone]   Fax:  [phone] [address]. [place], [place], [address] Phone: [phone]   Fax:  [phone]  Patient Details  Name: Ashley Pittman MRN: [id number] Date of Birth: 01-07-1981 Referring Provider:  Lucie Ruts, PA-C  Encounter Date: 07/16/2023   Laurelyn Ponder 07/16/2023, 9:28 AM  [facility] [address] [place], [place], [address] Phone: [phone]   Fax:  [phone] at [facility]  [address] [place], [place], [address] Phone: [phone]   Fax:  [phone]  [facility] at  [place] [address] [place], [place], [address] Phone: [phone]   Fax:  [phone]  Patient Details  Name: Ashley Pittman MRN: [id number] Date of Birth: 11-13-1980 Referring Provider:  Lucie Ruts, PA-C  Encounter Date: 07/16/2023   Laurelyn Ponder 07/16/2023, 9:28 AM  [facility] [address] [place], [place], [address] Phone: [phone]   Fax:  [phone]

## 2023-07-21 ENCOUNTER — Ambulatory Visit: Admitting: Physical Therapy

## 2023-08-18 ENCOUNTER — Encounter: Payer: Self-pay | Admitting: Dermatology

## 2023-08-18 ENCOUNTER — Ambulatory Visit: Payer: Medicaid Other | Admitting: Dermatology

## 2023-08-18 VITALS — BP 108/66

## 2023-08-18 DIAGNOSIS — L649 Androgenic alopecia, unspecified: Secondary | ICD-10-CM

## 2023-08-18 MED ORDER — SAFETY SEAL MISCELLANEOUS MISC: 1.0000 | Freq: Every morning | 4 refills | Status: DC

## 2023-08-18 NOTE — Progress Notes (Signed)
   New Patient Visit   Subjective  Ashley Pittman is a 43 y.o. female who presents for the following: Hair loss x ~ 5 years. She has tried different serums from [employer], hair gummies and Vitamin D. Nothing has really helped. She has also tried a derma roller.    The following portions of the chart were reviewed this encounter and updated as appropriate: medications, allergies, medical history  Review of Systems:  No other skin or systemic complaints except as noted in HPI or Assessment and Plan.  Objective  Well appearing patient in no apparent distress; mood and affect are within normal limits.   A focused examination was performed of the following areas:   Relevant exam findings are noted in the Assessment and Plan.             Assessment & Plan   ANDROGENETIC ALOPECIA (FEMALE PATTERN HAIR LOSS) Exam: Widened mid part with some thinning at the temples. 4-5 hairs at pull test.  Female Androgenic Alopecia is a chronic condition related to genetics and/or hormonal changes.  In women androgenetic alopecia is commonly associated with menopause but may occur any time after puberty.  It causes hair thinning primarily on the crown with widening of the part and temporal hairline recession.  Can use OTC Rogaine (minoxidil) 5% solution/foam as directed.   - Assessment: Female patient in early 10s presents with hair thinning ongoing for about 5 years, with noticeable scalp visibility and increased shedding. Clinical examination reveals wide midpart thinning and some thinning of the temples, consistent with androgenetic alopecia. Onset reported around age 32, worsening over past 5-6 years since moving to [place] . Family history significant for similar hair thinning in patient's mother, though at a later age. Patient has tried over-the-counter treatments including Serum [employer] and Vitamin D with minimal effect. Condition likely genetic and hormone-related, with increased  sensitivity to testosterone causing hair follicle miniaturization and eventual hair loss.  - Plan:    Prescribe combination topical solution of 8% minoxidil and finasteride     - Apply daily in the morning to affected areas of the scalp     - Patient informed about potential side effects, including unwanted hair growth if medication contacts other areas    Recommend over-the-counter supplements:     - Nutrafol: 4 tablets daily     - Collagen supplement (Vitoproteins brand preferred)    Suggest use of derma roller 3 times per week     - Educate on proper use and frequent replacement (every 1-2 weeks)    Patient education:     - Counsel on expectations: initial results may be visible in 4 months     - Educate on chronic nature of the condition and importance of consistent treatment   Return in about 4 months (around 12/19/2023) for Alopecia.  I, Eliot Guernsey, CMA, am acting as scribe for [employer], DO .   Documentation: I have reviewed the above documentation for accuracy and completeness, and I agree with the above.  Louana Roup, DO

## 2023-08-18 NOTE — Patient Instructions (Addendum)
 Date: Tue Aug 18 2023  Hello Ashley Pittman,  Thank you for visiting today. Here is a summary of the key instructions:  Diagnosis: Androgenetic Alopecia  - Medications: Apply prescription-strength minoxidil 8% with finasteride solution to scalp every morning   - Part hair and apply 3 drops, then massage in   - Repeat from front to back of scalp   - Can blow dry on low heat if needed   - Leave in, do not wash out  - Supplements:   - Take Nutrafol hair growth supplement, 4 tablets daily   - Take collagen supplement daily (gummies are okay)  - Hair Care: Use derma roller 3 times a week   - Replace derma roller every 1-2 weeks   - Do not apply any products after using derma roller  - Follow-up: Return in 4 months for follow-up appointment  - Other Instructions:   - Expect to see initial results in about 4 months   - Shedding may increase in the first month before improving   - Continue treatment to maintain results   - It's okay to take a 1-week break (e.g., for vacation)   - Do not stop treatment for 3 months or more  We look forward to seeing you at your next visit. If you have any questions or concerns before then, please do not hesitate to contact our office.  Warm regards,  Dr. Louana Roup Dermatology     Your provider has sent your prescription to [facility] in [place], [place] . A pharmacy representative will call you to confirm details and take your payment information. If you do not receive a call within 24 hours, please contact the pharmacy at [phone] or [phone]. Your unique skincare compound is being formulated in our lab (most compounds take less than 24 hours). Your prescription is shipped vis USPS to your mailbox (2-4 business days). Priority shipping is available at an additional cost. Once received, you will electronically sign/acknowledge that you received your prescription. The pharmacy hours are Monday-Friday 9 am-6 pm EST and Saturday 9 am-1 pm  EST.             Important Information  Due to recent changes in healthcare laws, you may see results of your pathology and/or laboratory studies on MyChart before the doctors have had a chance to review them. We understand that in some cases there may be results that are confusing or concerning to you. Please understand that not all results are received at the same time and often the doctors may need to interpret multiple results in order to provide you with the best plan of care or course of treatment. Therefore, we ask that you please give us  2 business days to thoroughly review all your results before contacting the office for clarification. Should we see a critical lab result, you will be contacted sooner.   If You Need Anything After Your Visit  If you have any questions or concerns for your doctor, please call our main line at [phone] If no one answers, please leave a voicemail as directed and we will return your call as soon as possible. Messages left after 4 pm will be answered the following business day.   You may also send us  a message via MyChart. We typically respond to MyChart messages within 1-2 business days.  For prescription refills, please ask your pharmacy to contact our office. Our fax number is [phone].  If you have an urgent issue when the clinic is closed that  cannot wait until the next business day, you can page your doctor at the number below.    Please note that while we do our best to be available for urgent issues outside of office hours, we are not available 24/7.   If you have an urgent issue and are unable to reach us , you may choose to seek medical care at your doctor's office, retail clinic, urgent care center, or emergency room.  If you have a medical emergency, please immediately call 911 or go to the emergency department. In the event of inclement weather, please call our main line at [phone] for an update on the status of any delays  or closures.  Dermatology Medication Tips: Please keep the boxes that topical medications come in in order to help keep track of the instructions about where and how to use these. Pharmacies typically print the medication instructions only on the boxes and not directly on the medication tubes.   If your medication is too expensive, please contact our office at [phone] or send us  a message through MyChart.   We are unable to tell what your co-pay for medications will be in advance as this is different depending on your insurance coverage. However, we may be able to find a substitute medication at lower cost or fill out paperwork to get insurance to cover a needed medication.   If a prior authorization is required to get your medication covered by your insurance company, please allow us  1-2 business days to complete this process.  Drug prices often vary depending on where the prescription is filled and some pharmacies may offer cheaper prices.  The website www.goodrx.com contains coupons for medications through different pharmacies. The prices here do not account for what the cost may be with help from insurance (it may be cheaper with your insurance), but the website can give you the price if you did not use any insurance.  - You can print the associated coupon and take it with your prescription to the pharmacy.  - You may also stop by our office during regular business hours and pick up a GoodRx coupon card.  - If you need your prescription sent electronically to a different pharmacy, notify our office through [facility] or by phone at [phone]

## 2023-12-30 ENCOUNTER — Encounter: Payer: Self-pay | Admitting: Dermatology

## 2023-12-30 ENCOUNTER — Ambulatory Visit: Admitting: Dermatology

## 2023-12-30 VITALS — BP 93/77

## 2023-12-30 DIAGNOSIS — L649 Androgenic alopecia, unspecified: Secondary | ICD-10-CM

## 2023-12-30 MED ORDER — SAFETY SEAL MISCELLANEOUS MISC: 1.0000 | Freq: Every morning | 5 refills | Status: AC

## 2023-12-30 NOTE — Progress Notes (Signed)
   Follow-Up Visit   Subjective  Ashley Pittman is a 43 y.o. female who presents for the following: Androgenetic alopecia follow up - last appt was 08/18/2023. She is using minoxidil 8%/finasteride compound from [facility] daily. She is not currently taking any supplements or using the derma roller. She has seen some improvement.   The following portions of the chart were reviewed this encounter and updated as appropriate: medications, allergies, medical history  Review of Systems:  No other skin or systemic complaints except as noted in HPI or Assessment and Plan.  Objective  Well appearing patient in no apparent distress; mood and affect are within normal limits.   A focused examination was performed of the following areas: Scalp  Relevant exam findings are noted in the Assessment and Plan.             Assessment & Plan   Androgenetic alopecia Alopecia with some improvement noted since May, with the patient reporting better hair growth on the sides of the scalp. Consistent use of topical treatment every morning. Some areas still show less regrowth, but overall progress is evident with new baby hairs growing. No use of hair growth supplements currently. - Continue topical minoxidil and finasteride combo with refills for six months from [facility]. - Recommend starting collagen supplements, preferably in gummy form if not consuming coffee or tea regularly. - Reassess progress in six months and consider oral treatment if not satisfied with results.      No follow-ups on file.  I, Roseline Hutchinson, CMA, am acting as scribe for [employer], DO .   Documentation: I have reviewed the above documentation for accuracy and completeness, and I agree with the above.  Delon Lenis, DO

## 2023-12-30 NOTE — Patient Instructions (Addendum)
 VISIT SUMMARY:  You came in today for a follow-up on your hair regrowth treatment. You have been using the topical treatment every morning and have noticed some improvement, especially on the sides of your scalp. You mentioned missing a few days of treatment but do not believe it has significantly impacted your progress. You are also interested in starting hair growth supplements.  YOUR PLAN:  -ANDROGENETIC ALOPECIA:  You have shown some improvement since May, with better hair growth on the sides of your scalp. Continue using the topical minoxidil and finasteride combination every morning. You can start taking collagen supplements, preferably in gummy form if you do not consume coffee or tea regularly. We will reassess your progress in six months and consider oral treatment if you are not satisfied with the results.  INSTRUCTIONS:  Please continue with your current topical treatment and start taking collagen supplements. We will reassess your progress in six months. If you have any concerns or notice any changes, do not hesitate to contact us .        Important Information  Due to recent changes in healthcare laws, you may see results of your pathology and/or laboratory studies on MyChart before the doctors have had a chance to review them. We understand that in some cases there may be results that are confusing or concerning to you. Please understand that not all results are received at the same time and often the doctors may need to interpret multiple results in order to provide you with the best plan of care or course of treatment. Therefore, we ask that you please give us  2 business days to thoroughly review all your results before contacting the office for clarification. Should we see a critical lab result, you will be contacted sooner.   If You Need Anything After Your Visit  If you have any questions or concerns for your doctor, please call our main line at [phone] If no  one answers, please leave a voicemail as directed and we will return your call as soon as possible. Messages left after 4 pm will be answered the following business day.   You may also send us  a message via MyChart. We typically respond to MyChart messages within 1-2 business days.  For prescription refills, please ask your pharmacy to contact our office. Our fax number is [phone].  If you have an urgent issue when the clinic is closed that cannot wait until the next business day, you can page your doctor at the number below.    Please note that while we do our best to be available for urgent issues outside of office hours, we are not available 24/7.   If you have an urgent issue and are unable to reach us , you may choose to seek medical care at your doctor's office, retail clinic, urgent care center, or emergency room.  If you have a medical emergency, please immediately call 911 or go to the emergency department. In the event of inclement weather, please call our main line at [phone] for an update on the status of any delays or closures.  Dermatology Medication Tips: Please keep the boxes that topical medications come in in order to help keep track of the instructions about where and how to use these. Pharmacies typically print the medication instructions only on the boxes and not directly on the medication tubes.   If your medication is too expensive, please contact our office at [phone] or send us  a message through MyChart.  We are unable to tell what your co-pay for medications will be in advance as this is different depending on your insurance coverage. However, we may be able to find a substitute medication at lower cost or fill out paperwork to get insurance to cover a needed medication.   If a prior authorization is required to get your medication covered by your insurance company, please allow us  1-2 business days to complete this process.  Drug prices often vary  depending on where the prescription is filled and some pharmacies may offer cheaper prices.  The website www.goodrx.com contains coupons for medications through different pharmacies. The prices here do not account for what the cost may be with help from insurance (it may be cheaper with your insurance), but the website can give you the price if you did not use any insurance.  - You can print the associated coupon and take it with your prescription to the pharmacy.  - You may also stop by our office during regular business hours and pick up a GoodRx coupon card.  - If you need your prescription sent electronically to a different pharmacy, notify our office through [facility] or by phone at [phone]

## 2024-07-04 ENCOUNTER — Ambulatory Visit: Admitting: Dermatology
